# Patient Record
Sex: Female | Born: 1955 | Race: Black or African American | Hispanic: No | Marital: Married | Smoking: Former smoker
Health system: Southern US, Community
[De-identification: ages and names within clinical notes are randomized; demographics above are authoritative.]

## PROBLEM LIST (undated history)

## (undated) ENCOUNTER — Emergency Department (HOSPITAL_COMMUNITY): Admission: EM | Source: Home / Self Care

## (undated) DIAGNOSIS — E119 Type 2 diabetes mellitus without complications: Secondary | ICD-10-CM

## (undated) DIAGNOSIS — E785 Hyperlipidemia, unspecified: Secondary | ICD-10-CM

## (undated) DIAGNOSIS — E669 Obesity, unspecified: Secondary | ICD-10-CM

## (undated) DIAGNOSIS — I10 Essential (primary) hypertension: Secondary | ICD-10-CM

## (undated) HISTORY — DX: Hyperlipidemia, unspecified: E78.5

## (undated) HISTORY — DX: Essential (primary) hypertension: I10

## (undated) HISTORY — PX: CATARACT EXTRACTION: SUR2

## (undated) HISTORY — DX: Obesity, unspecified: E66.9

## (undated) HISTORY — DX: Type 2 diabetes mellitus without complications: E11.9

## (undated) HISTORY — PX: BREAST BIOPSY: SHX20

---

## 1992-12-03 HISTORY — PX: ABDOMINAL HYSTERECTOMY: SHX81

## 1998-12-03 HISTORY — PX: BREAST BIOPSY: SHX20

## 2004-05-12 ENCOUNTER — Ambulatory Visit (HOSPITAL_COMMUNITY): Admission: RE | Admit: 2004-05-12 | Discharge: 2004-05-12 | Payer: Self-pay | Admitting: Obstetrics & Gynecology

## 2004-07-04 ENCOUNTER — Encounter: Admission: RE | Admit: 2004-07-04 | Discharge: 2004-07-04 | Payer: Self-pay | Admitting: Obstetrics & Gynecology

## 2005-05-17 ENCOUNTER — Ambulatory Visit (HOSPITAL_COMMUNITY): Admission: RE | Admit: 2005-05-17 | Discharge: 2005-05-17 | Payer: Self-pay | Admitting: Obstetrics and Gynecology

## 2006-07-18 ENCOUNTER — Ambulatory Visit (HOSPITAL_COMMUNITY): Admission: RE | Admit: 2006-07-18 | Discharge: 2006-07-18 | Payer: Self-pay | Admitting: Obstetrics & Gynecology

## 2006-09-02 ENCOUNTER — Ambulatory Visit (HOSPITAL_COMMUNITY): Admission: RE | Admit: 2006-09-02 | Discharge: 2006-09-02 | Payer: Self-pay | Admitting: Gastroenterology

## 2006-09-02 ENCOUNTER — Ambulatory Visit: Payer: Self-pay | Admitting: Gastroenterology

## 2007-09-11 ENCOUNTER — Ambulatory Visit (HOSPITAL_COMMUNITY): Admission: RE | Admit: 2007-09-11 | Discharge: 2007-09-11 | Payer: Self-pay | Admitting: Obstetrics & Gynecology

## 2008-10-07 ENCOUNTER — Ambulatory Visit (HOSPITAL_COMMUNITY): Admission: RE | Admit: 2008-10-07 | Discharge: 2008-10-07 | Payer: Self-pay | Admitting: Obstetrics & Gynecology

## 2010-01-09 ENCOUNTER — Ambulatory Visit (HOSPITAL_COMMUNITY): Admission: RE | Admit: 2010-01-09 | Discharge: 2010-01-09 | Payer: Self-pay | Admitting: Obstetrics & Gynecology

## 2010-12-24 ENCOUNTER — Encounter: Payer: Self-pay | Admitting: Obstetrics & Gynecology

## 2011-04-20 NOTE — Op Note (Signed)
NAMEMELODEE, LUPE                ACCOUNT NO.:  [id number]   MEDICAL RECORD NO.:  [id number]          PATIENT TYPE:  AMB   LOCATION:  DAY                           FACILITY:  [facility]   PHYSICIAN:  Kassie Mends, M.D.      DATE OF BIRTH:  [birth year]   DATE OF PROCEDURE:  09/02/2006  DATE OF DISCHARGE:                                 OPERATIVE REPORT   PROCEDURE:  Colonoscopy.   INDICATION FOR EXAM:  Dana Boyd is a 55 year old female who presents for  average risk colon cancer screening.   FINDINGS:  1. Normal colon.  No polyps, masses, inflammatory changes, or vascular      ectasias seen.  No diverticula evident.  2. Normal retroflexed view of the rectum.   RECOMMENDATIONS:  1. Screening colonoscopy in 10 years.  2. Follow up with Dr. Jacelyn Pi.   MEDICATIONS:  1. Demerol 75 mg IV.  2. Versed 4 mg IV.   PROCEDURE/TECHNIQUE:  Physician exam was performed, and informed consent was  obtained from the patient after explaining the benefits, risks, and  alternatives to the procedure.  The patient was connected to the monitor and  placed in the left lateral position.  Continuous oxygen was provided by  nasal cannula and IV medications administered through an indwelling cannula.  After administration of sedation and regular exam, the patient's rectum was  intubated, and the scope was advanced under direct visualization to the  cecum.  The scope was subsequently removed slowly and carefully anatomy,  integrity, and texture of the mucosa on the way out.  The patient was  recovered in endoscopy suite and discharged home in satisfactory condition.      Kassie Mends, M.D.  Electronically Signed     SM/MEDQ  D:  09/02/2006  T:  09/03/2006  Job:  [id number]   cc:   Jacelyn Pi, MD

## 2011-05-07 ENCOUNTER — Other Ambulatory Visit: Payer: Self-pay | Admitting: Obstetrics & Gynecology

## 2011-05-07 DIAGNOSIS — Z139 Encounter for screening, unspecified: Secondary | ICD-10-CM

## 2011-05-15 ENCOUNTER — Ambulatory Visit (HOSPITAL_COMMUNITY)
Admission: RE | Admit: 2011-05-15 | Discharge: 2011-05-15 | Disposition: A | Discharge status: Home / Self Care | Payer: Managed Care, Other (non HMO) | Source: Ambulatory Visit | Attending: Obstetrics & Gynecology | Admitting: Obstetrics & Gynecology

## 2011-05-15 DIAGNOSIS — Z1231 Encounter for screening mammogram for malignant neoplasm of breast: Secondary | ICD-10-CM | POA: Insufficient documentation

## 2011-05-15 DIAGNOSIS — Z139 Encounter for screening, unspecified: Secondary | ICD-10-CM

## 2012-05-26 ENCOUNTER — Other Ambulatory Visit: Payer: Self-pay | Admitting: Obstetrics & Gynecology

## 2012-05-26 DIAGNOSIS — Z139 Encounter for screening, unspecified: Secondary | ICD-10-CM

## 2012-06-02 ENCOUNTER — Ambulatory Visit (HOSPITAL_COMMUNITY): Payer: Managed Care, Other (non HMO)

## 2012-06-09 ENCOUNTER — Ambulatory Visit (HOSPITAL_COMMUNITY): Payer: Managed Care, Other (non HMO)

## 2012-06-16 ENCOUNTER — Ambulatory Visit (HOSPITAL_COMMUNITY): Admission: RE | Admit: 2012-06-16 | Payer: Managed Care, Other (non HMO) | Source: Ambulatory Visit

## 2012-07-28 ENCOUNTER — Ambulatory Visit (HOSPITAL_COMMUNITY)
Admission: RE | Admit: 2012-07-28 | Discharge: 2012-07-28 | Disposition: A | Discharge status: Home / Self Care | Payer: Managed Care, Other (non HMO) | Source: Ambulatory Visit | Attending: Obstetrics & Gynecology | Admitting: Obstetrics & Gynecology

## 2012-07-28 DIAGNOSIS — Z1231 Encounter for screening mammogram for malignant neoplasm of breast: Secondary | ICD-10-CM | POA: Insufficient documentation

## 2012-07-28 DIAGNOSIS — Z139 Encounter for screening, unspecified: Secondary | ICD-10-CM

## 2013-05-26 ENCOUNTER — Other Ambulatory Visit: Payer: Self-pay | Admitting: Obstetrics & Gynecology

## 2013-05-26 DIAGNOSIS — Z139 Encounter for screening, unspecified: Secondary | ICD-10-CM

## 2013-06-14 ENCOUNTER — Other Ambulatory Visit: Payer: Self-pay | Admitting: Obstetrics & Gynecology

## 2013-08-10 ENCOUNTER — Ambulatory Visit (HOSPITAL_COMMUNITY): Payer: Managed Care, Other (non HMO)

## 2013-08-17 ENCOUNTER — Other Ambulatory Visit: Payer: Self-pay | Admitting: Obstetrics & Gynecology

## 2013-08-31 ENCOUNTER — Inpatient Hospital Stay (HOSPITAL_COMMUNITY): Admission: RE | Admit: 2013-08-31 | Payer: Managed Care, Other (non HMO) | Source: Ambulatory Visit

## 2013-09-15 ENCOUNTER — Other Ambulatory Visit: Payer: Self-pay | Admitting: Obstetrics & Gynecology

## 2013-09-17 ENCOUNTER — Encounter: Payer: Self-pay | Admitting: Obstetrics & Gynecology

## 2013-09-17 ENCOUNTER — Ambulatory Visit (INDEPENDENT_AMBULATORY_CARE_PROVIDER_SITE_OTHER): Payer: BC Managed Care – PPO | Admitting: Obstetrics & Gynecology

## 2013-09-17 ENCOUNTER — Encounter (INDEPENDENT_AMBULATORY_CARE_PROVIDER_SITE_OTHER): Payer: Self-pay

## 2013-09-17 VITALS — BP 158/70 | Ht 62.0 in | Wt 194.0 lb

## 2013-09-17 DIAGNOSIS — Z01419 Encounter for gynecological examination (general) (routine) without abnormal findings: Secondary | ICD-10-CM

## 2013-09-17 DIAGNOSIS — Z1212 Encounter for screening for malignant neoplasm of rectum: Secondary | ICD-10-CM

## 2013-09-17 DIAGNOSIS — I10 Essential (primary) hypertension: Secondary | ICD-10-CM | POA: Insufficient documentation

## 2013-09-17 DIAGNOSIS — E7800 Pure hypercholesterolemia, unspecified: Secondary | ICD-10-CM

## 2013-09-17 NOTE — Progress Notes (Signed)
Patient ID: Dana Boyd, female   DOB: 25-Aug-1956, 57 y.o.   MRN: [id number] Subjective:     Dana Boyd is a 57 y.o. female here for a routine exam.  No LMP recorded. Patient has had a hysterectomy. No obstetric history on file. Current complaints: none.     Gynecologic History No LMP recorded. Patient has had a hysterectomy. Contraception: status post hysterectomy Last Pap: na. Results were: normal Last mammogram: 2013. Results were: normal  Past Medical History  Diagnosis Date  . Hyperlipidemia   . Hypertension     Past Surgical History  Procedure Laterality Date  . Cesarean section    . Breast biopsy Right     OB History   Grav Para Term Preterm Abortions TAB SAB Ect Mult Living                  History   Social History  . Marital Status: Married    Spouse Name: N/A    Number of Children: N/A  . Years of Education: N/A   Social History Main Topics  . Smoking status: Former Smoker    Types: Cigarettes  . Smokeless tobacco: Never Used  . Alcohol Use: Yes     Comment: occ  . Drug Use: No  . Sexual Activity: Yes    Birth Control/ Protection: Surgical   Other Topics Concern  . None   Social History Narrative  . None    Family History  Problem Relation Age of Onset  . Heart attack Mother      Review of Systems  Review of Systems  Constitutional: Negative for fever, chills, weight loss, malaise/fatigue and diaphoresis.  HENT: Negative for hearing loss, ear pain, nosebleeds, congestion, sore throat, neck pain, tinnitus and ear discharge.   Eyes: Negative for blurred vision, double vision, photophobia, pain, discharge and redness.  Respiratory: Negative for cough, hemoptysis, sputum production, shortness of breath, wheezing and stridor.   Cardiovascular: Negative for chest pain, palpitations, orthopnea, claudication, leg swelling and PND.  Gastrointestinal: negative for abdominal pain. Negative for heartburn, nausea, vomiting, diarrhea,  constipation, blood in stool and melena.  Genitourinary: Negative for dysuria, urgency, frequency, hematuria and flank pain.  Musculoskeletal: Negative for myalgias, back pain, joint pain and falls.  Skin: Negative for itching and rash.  Neurological: Negative for dizziness, tingling, tremors, sensory change, speech change, focal weakness, seizures, loss of consciousness, weakness and headaches.  Endo/Heme/Allergies: Negative for environmental allergies and polydipsia. Does not bruise/bleed easily.  Psychiatric/Behavioral: Negative for depression, suicidal ideas, hallucinations, memory loss and substance abuse. The patient is not nervous/anxious and does not have insomnia.        Objective:    Physical Exam  Vitals reviewed. Constitutional: She is oriented to person, place, and time. She appears well-developed and well-nourished.  HENT:  Head: Normocephalic and atraumatic.        Right Ear: External ear normal.  Left Ear: External ear normal.  Nose: Nose normal.  Mouth/Throat: Oropharynx is clear and moist.  Eyes: Conjunctivae and EOM are normal. Pupils are equal, round, and reactive to light. Right eye exhibits no discharge. Left eye exhibits no discharge. No scleral icterus.  Neck: Normal range of motion. Neck supple. No tracheal deviation present. No thyromegaly present.  Cardiovascular: Normal rate, regular rhythm, normal heart sounds and intact distal pulses.  Exam reveals no gallop and no friction rub.   No murmur heard. Respiratory: Effort normal and breath sounds normal. No respiratory distress. She has no wheezes.  She has no rales. She exhibits no tenderness.  GI: Soft. Bowel sounds are normal. She exhibits no distension and no mass. There is no tenderness. There is no rebound and no guarding.  Genitourinary:  Breasts no masses skin changes or nipple changes bilaterally      Vulva is normal without lesions Vagina is pink moist without discharge Cervix absent Uterus is  absent Adnexa is negative Rectal    hemoccult negative, normal tone, no masses, hemorrhoids  Musculoskeletal: Normal range of motion. She exhibits no edema and no tenderness.  Neurological: She is alert and oriented to person, place, and time. She has normal reflexes. She displays normal reflexes. No cranial nerve deficit. She exhibits normal muscle tone. Coordination normal.  Skin: Skin is warm and dry. No rash noted. No erythema. No pallor.  Psychiatric: She has a normal mood and affect. Her behavior is normal. Judgment and thought content normal.       Assessment:    Healthy female exam.    Plan:    Mammogram ordered. Follow up in: 1 year.

## 2013-10-05 ENCOUNTER — Ambulatory Visit (HOSPITAL_COMMUNITY)
Admission: RE | Admit: 2013-10-05 | Discharge: 2013-10-05 | Disposition: A | Discharge status: Home / Self Care | Payer: BC Managed Care – PPO | Source: Ambulatory Visit | Attending: Obstetrics & Gynecology | Admitting: Obstetrics & Gynecology

## 2013-10-05 DIAGNOSIS — Z1231 Encounter for screening mammogram for malignant neoplasm of breast: Secondary | ICD-10-CM | POA: Insufficient documentation

## 2013-10-05 DIAGNOSIS — Z139 Encounter for screening, unspecified: Secondary | ICD-10-CM

## 2013-10-09 ENCOUNTER — Other Ambulatory Visit: Payer: Self-pay | Admitting: Obstetrics & Gynecology

## 2013-10-09 DIAGNOSIS — R928 Other abnormal and inconclusive findings on diagnostic imaging of breast: Secondary | ICD-10-CM

## 2013-11-18 ENCOUNTER — Other Ambulatory Visit: Payer: Self-pay | Admitting: Obstetrics & Gynecology

## 2013-11-18 ENCOUNTER — Ambulatory Visit (HOSPITAL_COMMUNITY)
Admission: RE | Admit: 2013-11-18 | Discharge: 2013-11-18 | Disposition: A | Discharge status: Home / Self Care | Payer: BC Managed Care – PPO | Source: Ambulatory Visit | Attending: Obstetrics & Gynecology | Admitting: Obstetrics & Gynecology

## 2013-11-18 DIAGNOSIS — R928 Other abnormal and inconclusive findings on diagnostic imaging of breast: Secondary | ICD-10-CM | POA: Insufficient documentation

## 2013-11-18 DIAGNOSIS — N6009 Solitary cyst of unspecified breast: Secondary | ICD-10-CM | POA: Insufficient documentation

## 2014-01-06 IMAGING — US US BREAST*R*
1 series · 2 of 2 positions shown · non-contrast
Comparison: With priors

CLINICAL DATA: Abnormal right screening mammogram.

EXAM:
DIGITAL DIAGNOSTIC  RIGHT MAMMOGRAM
ULTRASOUND RIGHT BREAST

[Series 1: us breast*right* · 0.05mm/px · 2 of 2 slices shown]
[im 1/2]
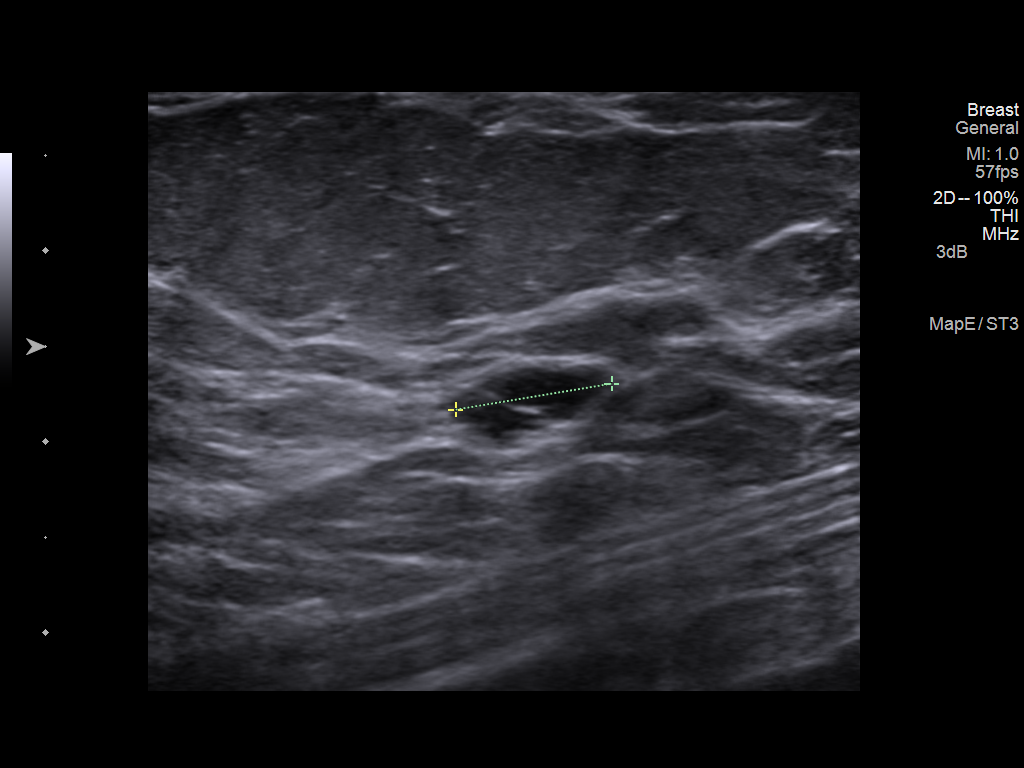
[im 2/2]
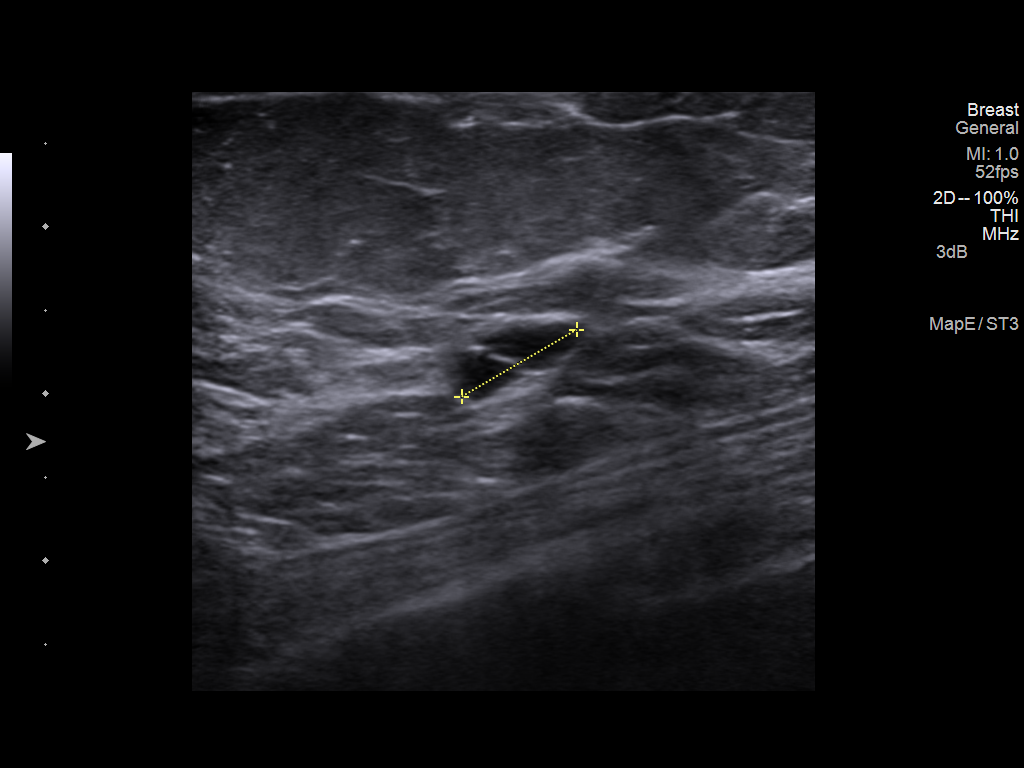

[2 of 2 positions shown; findings below may reference images not displayed]

ACR Breast Density Category b: There are scattered areas of
fibroglandular density.
FINDINGS: Spot compression views of the medial aspect of the left breast was
performed. There is persistent of a low-density 8 mm partially
obscured nodule.

On physical exam I do not palpate a mass in the right breast.

Ultrasound is performed, showing a cluster of cysts in the right
breast at 4 o'clock 2 cm from the nipple measuring 8 x 4 x 8 mm. No
solid mass is detected.
IMPRESSION: Right breast cysts.  No evidence of malignancy.

RECOMMENDATION:
Bilateral screening mammogram in 1 year is recommended.

I have discussed the findings and recommendations with the patient.
Results were also provided in writing at the conclusion of the
visit. If applicable, a reminder letter will be sent to the patient
regarding the next appointment.

BI-RADS CATEGORY  2: Benign Finding(s)

## 2014-10-07 ENCOUNTER — Other Ambulatory Visit (HOSPITAL_COMMUNITY): Payer: Self-pay | Admitting: Family Medicine

## 2014-10-07 DIAGNOSIS — Z1231 Encounter for screening mammogram for malignant neoplasm of breast: Secondary | ICD-10-CM

## 2014-11-22 ENCOUNTER — Ambulatory Visit (HOSPITAL_COMMUNITY)
Admission: RE | Admit: 2014-11-22 | Discharge: 2014-11-22 | Disposition: A | Discharge status: Home / Self Care | Payer: BC Managed Care – PPO | Source: Ambulatory Visit | Attending: Family Medicine | Admitting: Family Medicine

## 2014-11-22 DIAGNOSIS — Z1231 Encounter for screening mammogram for malignant neoplasm of breast: Secondary | ICD-10-CM | POA: Diagnosis not present

## 2016-02-03 ENCOUNTER — Encounter: Payer: Self-pay | Admitting: *Deleted

## 2016-02-07 ENCOUNTER — Encounter: Payer: Self-pay | Admitting: Obstetrics and Gynecology

## 2016-02-07 ENCOUNTER — Ambulatory Visit (INDEPENDENT_AMBULATORY_CARE_PROVIDER_SITE_OTHER): Payer: BLUE CROSS/BLUE SHIELD | Admitting: Obstetrics and Gynecology

## 2016-02-07 VITALS — BP 142/80 | Ht 63.0 in | Wt 191.5 lb

## 2016-02-07 DIAGNOSIS — N83202 Unspecified ovarian cyst, left side: Secondary | ICD-10-CM | POA: Diagnosis not present

## 2016-02-07 DIAGNOSIS — N83201 Unspecified ovarian cyst, right side: Secondary | ICD-10-CM | POA: Insufficient documentation

## 2016-02-07 NOTE — Progress Notes (Signed)
Patient ID: Dana Boyd, female   DOB: 1956/04/08, 60 y.o.   MRN: [id number] Pt here today for "pelvic cyst". Pt was sent by [facility], pt was told that she had a cyst and was not told where it was. Pt is S/P abdominal hysterectomy, with ovaries remaining. Pt denies any pain at this time. Pt states that she did have an [place] for this problem.

## 2016-02-07 NOTE — Progress Notes (Signed)
Patient ID: Dana Boyd, female   DOB: February 05, 1956, 60 y.o.   MRN: [id number]   [facility] Visit  Patient name: Dana Boyd MRN [id number]  Date of birth: [birth year]  CC & HPI:  Dana Boyd is a 60 y.o. female presenting today for referrral from [facility] for ovarian cyst on left  ROS:  Pt had tah rso done in past for prior ov cyst. One ovary remains.  Pertinent History Reviewed:   Reviewed: Significant for assymptomatic cyst Medical         Past Medical History  Diagnosis Date  . Hyperlipidemia   . Hypertension   . Obesity                               Surgical Hx:    Past Surgical History  Procedure Laterality Date  . Cesarean section    . Breast biopsy Right   . Abdominal hysterectomy  1994    left the ovaries   Medications: Reviewed & Updated - see associated section                       Current outpatient prescriptions:  .  hydrochlorothiazide (HYDRODIURIL) 25 MG tablet, Take 25 mg by mouth daily., Disp: , Rfl:  .  atorvastatin (LIPITOR) 20 MG tablet, Take 20 mg by mouth daily. Reported on 02/07/2016, Disp: , Rfl:    Social History: Reviewed -  reports that she has quit smoking. Her smoking use included Cigarettes. She quit after 15 years of use. She has never used smokeless tobacco.  Objective Findings:  Vitals: Blood pressure 142/80, height 5\' 3"  (1.6 m), weight 191 lb 8 oz (86.864 kg).  Physical Examination: General appearance - alert, well appearing, and in no distress, oriented to person, place, and time and overweight Mental status - alert, oriented to person, place, and time, normal mood, behavior, speech, dress, motor activity, and thought processes Eyes - pupils equal and reactive, extraocular eye movements intact   Assessment & Plan:   A:  1. Simple cyst postmenopausal   P:  1. Check Ca 125,  2  Recheck 3 months if normal.

## 2016-02-08 ENCOUNTER — Telehealth: Payer: Self-pay | Admitting: *Deleted

## 2016-02-08 LAB — CA 125: CA 125: 7.5 U/mL (ref 0.0–38.1)

## 2016-02-08 NOTE — Telephone Encounter (Signed)
-----   Message from Jonnie Kind, MD sent at 02/08/2016  3:56 PM EST ----- Normal ca 125

## 2016-02-08 NOTE — Telephone Encounter (Signed)
Pt aware of Ca125 results.

## 2016-05-14 ENCOUNTER — Other Ambulatory Visit: Payer: Self-pay | Admitting: Obstetrics and Gynecology

## 2016-05-14 DIAGNOSIS — N83202 Unspecified ovarian cyst, left side: Secondary | ICD-10-CM

## 2016-05-16 ENCOUNTER — Ambulatory Visit (INDEPENDENT_AMBULATORY_CARE_PROVIDER_SITE_OTHER): Payer: BLUE CROSS/BLUE SHIELD | Admitting: Obstetrics and Gynecology

## 2016-05-16 ENCOUNTER — Ambulatory Visit (INDEPENDENT_AMBULATORY_CARE_PROVIDER_SITE_OTHER): Payer: BLUE CROSS/BLUE SHIELD

## 2016-05-16 ENCOUNTER — Encounter: Payer: Self-pay | Admitting: Obstetrics and Gynecology

## 2016-05-16 ENCOUNTER — Other Ambulatory Visit: Payer: Self-pay | Admitting: Obstetrics and Gynecology

## 2016-05-16 VITALS — BP 132/80 | Ht 62.0 in | Wt 184.0 lb

## 2016-05-16 DIAGNOSIS — N83202 Unspecified ovarian cyst, left side: Secondary | ICD-10-CM

## 2016-05-16 DIAGNOSIS — N83201 Unspecified ovarian cyst, right side: Secondary | ICD-10-CM

## 2016-05-16 NOTE — Progress Notes (Signed)
Patient ID: Dana Boyd, female   DOB: 07-18-1956, 60 y.o.   MRN: [id number]    [facility] Visit  @DATE @            Patient name: Dana Boyd MRN [id number]  Date of birth: 15-Jun-1956  CC & HPI:  Dana Boyd is a 60 y.o. female presenting today to discuss [place] results. She has no complaints at this time.  Pt was seen 02/07/2016 for referral for evaluation of simple postmenopausal ovarian cyst.  ROS:  Review of Systems  All other systems reviewed and are negative.  Pertinent History Reviewed:   Reviewed: Significant for cesarean section, partial abdominal hysterectomy Medical         Past Medical History  Diagnosis Date  . Hyperlipidemia   . Hypertension   . Obesity                               Surgical Hx:    Past Surgical History  Procedure Laterality Date  . Cesarean section    . Breast biopsy Right   . Abdominal hysterectomy  1994    left the ovaries   Medications: Reviewed & Updated - see associated section                       Current outpatient prescriptions:  .  atorvastatin (LIPITOR) 20 MG tablet, Take 20 mg by mouth daily. Reported on 02/07/2016, Disp: , Rfl:  .  hydrochlorothiazide (HYDRODIURIL) 25 MG tablet, Take 25 mg by mouth daily., Disp: , Rfl:    Social History: Reviewed -  reports that she has quit smoking. Her smoking use included Cigarettes. She quit after 15 years of use. She has never used smokeless tobacco.  Objective Findings:  Vitals: Blood pressure 132/80, height 5\' 2"  (1.575 m), weight 184 lb (83.462 kg).  Physical Examination: discussion only  Discussed with pt risks and benefits of removal of cyst. At end of discussion, pt had opportunity to ask questions and has no further questions at this time. Reassuring factors such as simple thin cyst membrane, absence of internal echoes solid components or thick septations discussed with the patient. Pros and cons of observation versus surgical removal discussed with patient with  recommendations that interval follow-up would be very reasonable option  Greater than 50% was spent in counseling and coordination of care with the patient. Total time greater than: 15 minutes  Assessment & Plan:   A:  1. Simple 5 cm unilocular ovarian vs. paratubal cyst on right without septation or Solid component  P:  1. Repeat US in 6 months 2. Follow up prn   By signing my name below, I, Rowan Blase, attest that this documentation has been prepared under the direction and in the presence of Jonnie Kind, MD . Electronically Signed: Rowan Blase, Scribe. 05/16/2016. 2:24 PM.  I personally performed the services described in this documentation, which was SCRIBED in my presence. The recorded information has been reviewed and considered accurate. It has been edited as necessary during review. Jonnie Kind, MD

## 2016-05-16 NOTE — Progress Notes (Signed)
PELVIC US TA/TV W/DOPPLER: normal vag cuff,simple right ov cyst 5.1 x 4.6 x 3.3 cm w/arterial and venous flow seen,normal lt ov(ov's appear to be mobile),no free fluid,no pain during ultrasound.

## 2016-08-01 ENCOUNTER — Telehealth: Payer: Self-pay | Admitting: Gastroenterology

## 2016-08-01 NOTE — Telephone Encounter (Signed)
Pt is due 10 yr colonoscopy °

## 2016-08-01 NOTE — Telephone Encounter (Signed)
Letter mailed

## 2016-08-09 ENCOUNTER — Telehealth: Payer: Self-pay

## 2016-08-09 NOTE — Telephone Encounter (Signed)
Pt received letter that it was time to schedule her colonoscopy. Please call [phone]

## 2016-08-13 ENCOUNTER — Other Ambulatory Visit (HOSPITAL_COMMUNITY): Payer: Self-pay | Admitting: Internal Medicine

## 2016-08-13 ENCOUNTER — Telehealth: Payer: Self-pay

## 2016-08-13 DIAGNOSIS — Z1231 Encounter for screening mammogram for malignant neoplasm of breast: Secondary | ICD-10-CM

## 2016-08-13 NOTE — Telephone Encounter (Signed)
See separate triage.  

## 2016-08-16 ENCOUNTER — Ambulatory Visit (HOSPITAL_COMMUNITY)
Admission: RE | Admit: 2016-08-16 | Discharge: 2016-08-16 | Disposition: A | Discharge status: Home / Self Care | Payer: BLUE CROSS/BLUE SHIELD | Source: Ambulatory Visit | Attending: Internal Medicine | Admitting: Internal Medicine

## 2016-08-16 DIAGNOSIS — Z1231 Encounter for screening mammogram for malignant neoplasm of breast: Secondary | ICD-10-CM | POA: Insufficient documentation

## 2016-08-22 ENCOUNTER — Other Ambulatory Visit: Payer: Self-pay

## 2016-08-22 DIAGNOSIS — Z1211 Encounter for screening for malignant neoplasm of colon: Secondary | ICD-10-CM

## 2016-08-22 NOTE — Telephone Encounter (Signed)
PREPOPIK-DRINK WATER TO KEEP URINE LIGHT YELLOW.  FULL LIQUIDS WITH BREAKFAST.  Full Liquid Diet A high-calorie, high-protein supplement should be used to meet your nutritional requirements when the full liquid diet is continued for more than 2 or 3 days. If this diet is to be used for an extended period of time (more than 7 days), a multivitamin should be considered.  Breads and Starches  Allowed: None are allowed   Avoid: Any others.    Potatoes/Pasta/Rice  Allowed: ANY ITEM AS A SOUP OR SMALL PLATE OF MASHED POTATOES OR SCRAMBLED EGGS.       Vegetables  Allowed: Strained tomato or vegetable juice. Vegetables pureed in soup.   Avoid: Any others.    Fruit  Allowed: Any strained fruit juices and fruit drinks. Include 1 serving of citrus or vitamin C-enriched fruit juice daily.   Avoid: Any others.  Meat and Meat Substitutes  Allowed: Egg  Avoid: Any meat, fish, or fowl. All cheese.  Milk  Allowed: SOY Milk beverages, including milk shakes and instant breakfast mixes. Smooth yogurt.   Avoid: Any others. Avoid dairy products if not tolerated.    Soups and Combination Foods  Allowed: Broth, strained cream soups. Strained, broth-based soups.   Avoid: Any others.    Desserts and Sweets  Allowed: flavored gelatin, tapioca, ice cream, sherbet, smooth pudding, junket, fruit ices, frozen ice pops, pudding pops, frozen fudge pops, chocolate syrup. Sugar, honey, jelly, syrup.   Avoid: Any others.  Fats and Oils  Allowed: Margarine, butter, cream, sour cream, oils.   Avoid: Any others.  Beverages  Allowed: All.   Avoid: None.  Condiments  Allowed: Iodized salt, pepper, spices, flavorings. Cocoa powder.   Avoid: Any others.    SAMPLE MEAL PLAN Breakfast   cup orange juice.   1 OR 2 EGGS  1 cup milk.   1 cup beverage (coffee or tea).   Cream or sugar, if desired.    Midmorning Snack  2 SCRAMBLED OR HARD BOILED EGG   Lunch  1 cup cream  soup.    cup fruit juice.   1 cup milk.    cup custard.   1 cup beverage (coffee or tea).   Cream or sugar, if desired.    Midafternoon Snack  1 cup milk shake.  Dinner  1 cup cream soup.    cup fruit juice.   1 cup MILK    cup pudding.   1 cup beverage (coffee or tea).   Cream or sugar, if desired.  Evening Snack  1 cup supplement.  To increase calories, add sugar, cream, butter, or margarine if possible. Nutritional supplements will also increase the total calories.

## 2016-08-22 NOTE — Telephone Encounter (Signed)
Gastroenterology Pre-Procedure Review  Request Date:08/13/2016 Requesting Physician: ON RECALL  PATIENT REVIEW QUESTIONS: The patient responded to the following health history questions as indicated:    1. Diabetes Melitis: no 2. Joint replacements in the past 12 months: no 3. Major health problems in the past 3 months: no 4. Has an artificial valve or MVP: no 5. Has a defibrillator: no 6. Has been advised in past to take antibiotics in advance of a procedure like teeth cleaning: no 7. Family history of colon cancer: no  8. Alcohol Use: no 9. History of sleep apnea: no     MEDICATIONS & ALLERGIES:    Patient reports the following regarding taking any blood thinners:   Plavix? no Aspirin? no Coumadin? no  Patient confirms/reports the following medications:  Current Outpatient Prescriptions  Medication Sig Dispense Refill  . hydrochlorothiazide (HYDRODIURIL) 25 MG tablet Take 25 mg by mouth daily.    . NON FORMULARY Vitamin D  1000 IU   One daily    . Omega-3 Fatty Acids (FISH OIL) 1200 MG CPDR Take by mouth.    Marland Kitchen atorvastatin (LIPITOR) 20 MG tablet Take 20 mg by mouth daily. Reported on 02/07/2016     No current facility-administered medications for this visit.     Patient confirms/reports the following allergies:  No Known Allergies  No orders of the defined types were placed in this encounter.   AUTHORIZATION INFORMATION Primary Insurance:   ID #:   Group #:  Pre-Cert / Auth required:  Pre-Cert / Auth #:   Secondary Insurance:  ID #:   Group #:  Pre-Cert / Auth required: Pre-Cert / Auth #:   SCHEDULE INFORMATION: Procedure has been scheduled as follows:  Date:   09/03/2016                Time:  11:15 AM Location: [facility] Stay  This Gastroenterology Pre-Precedure Review Form is being routed to the following provider(s): Barney Drain, MD

## 2016-08-27 MED ORDER — SOD PICOSULFATE-MAG OX-CIT ACD 10-3.5-12 MG-GM-GM PO PACK: 1.0000 | PACK | ORAL | 0 refills | Status: DC

## 2016-08-27 NOTE — Telephone Encounter (Signed)
Rx sent to the pharmacy and instructions mailed to pt.  

## 2016-08-31 ENCOUNTER — Telehealth: Payer: Self-pay

## 2016-08-31 NOTE — Telephone Encounter (Signed)
Per Ginger, PA not required for screening colonoscopy. Ref # [id number].

## 2016-09-03 ENCOUNTER — Encounter (HOSPITAL_COMMUNITY)
Admission: RE | Disposition: A | Discharge status: Home / Self Care | Payer: Self-pay | Source: Ambulatory Visit | Attending: Gastroenterology

## 2016-09-03 ENCOUNTER — Ambulatory Visit (HOSPITAL_COMMUNITY)
Admission: RE | Admit: 2016-09-03 | Discharge: 2016-09-03 | Disposition: A | Discharge status: Home / Self Care | Payer: BLUE CROSS/BLUE SHIELD | Source: Ambulatory Visit | Attending: Gastroenterology | Admitting: Gastroenterology

## 2016-09-03 ENCOUNTER — Encounter (HOSPITAL_COMMUNITY): Payer: Self-pay | Admitting: *Deleted

## 2016-09-03 DIAGNOSIS — K648 Other hemorrhoids: Secondary | ICD-10-CM | POA: Diagnosis not present

## 2016-09-03 DIAGNOSIS — Z1211 Encounter for screening for malignant neoplasm of colon: Secondary | ICD-10-CM | POA: Diagnosis not present

## 2016-09-03 DIAGNOSIS — Z9889 Other specified postprocedural states: Secondary | ICD-10-CM | POA: Insufficient documentation

## 2016-09-03 DIAGNOSIS — Z79899 Other long term (current) drug therapy: Secondary | ICD-10-CM | POA: Insufficient documentation

## 2016-09-03 DIAGNOSIS — Z818 Family history of other mental and behavioral disorders: Secondary | ICD-10-CM | POA: Insufficient documentation

## 2016-09-03 DIAGNOSIS — Z90710 Acquired absence of both cervix and uterus: Secondary | ICD-10-CM | POA: Insufficient documentation

## 2016-09-03 DIAGNOSIS — Z820 Family history of epilepsy and other diseases of the nervous system: Secondary | ICD-10-CM | POA: Insufficient documentation

## 2016-09-03 DIAGNOSIS — Z87891 Personal history of nicotine dependence: Secondary | ICD-10-CM | POA: Diagnosis not present

## 2016-09-03 DIAGNOSIS — E785 Hyperlipidemia, unspecified: Secondary | ICD-10-CM | POA: Insufficient documentation

## 2016-09-03 DIAGNOSIS — K644 Residual hemorrhoidal skin tags: Secondary | ICD-10-CM | POA: Diagnosis not present

## 2016-09-03 DIAGNOSIS — Z8249 Family history of ischemic heart disease and other diseases of the circulatory system: Secondary | ICD-10-CM | POA: Insufficient documentation

## 2016-09-03 DIAGNOSIS — Z6834 Body mass index (BMI) 34.0-34.9, adult: Secondary | ICD-10-CM | POA: Insufficient documentation

## 2016-09-03 DIAGNOSIS — Z801 Family history of malignant neoplasm of trachea, bronchus and lung: Secondary | ICD-10-CM | POA: Diagnosis not present

## 2016-09-03 DIAGNOSIS — E669 Obesity, unspecified: Secondary | ICD-10-CM | POA: Insufficient documentation

## 2016-09-03 DIAGNOSIS — Q438 Other specified congenital malformations of intestine: Secondary | ICD-10-CM | POA: Diagnosis not present

## 2016-09-03 DIAGNOSIS — D120 Benign neoplasm of cecum: Secondary | ICD-10-CM | POA: Diagnosis not present

## 2016-09-03 DIAGNOSIS — I10 Essential (primary) hypertension: Secondary | ICD-10-CM | POA: Insufficient documentation

## 2016-09-03 HISTORY — PX: COLONOSCOPY: SHX5424

## 2016-09-03 SURGERY — COLONOSCOPY
Anesthesia: Moderate Sedation

## 2016-09-03 MED ORDER — MIDAZOLAM HCL 5 MG/5ML IJ SOLN
INTRAMUSCULAR | Status: AC
  Filled 2016-09-03: qty 10

## 2016-09-03 MED ORDER — MIDAZOLAM HCL 5 MG/5ML IJ SOLN
INTRAMUSCULAR | Status: DC | PRN
  Administered 2016-09-03: 2 mg via INTRAVENOUS
  Administered 2016-09-03 (×2): 1 mg via INTRAVENOUS
  Administered 2016-09-03: 2 mg via INTRAVENOUS

## 2016-09-03 MED ORDER — SODIUM CHLORIDE 0.9 % IV SOLN
INTRAVENOUS | Status: DC
  Administered 2016-09-03: 11:00:00 via INTRAVENOUS

## 2016-09-03 MED ORDER — MEPERIDINE HCL 100 MG/ML IJ SOLN
INTRAMUSCULAR | Status: DC | PRN
  Administered 2016-09-03 (×2): 25 mg via INTRAVENOUS

## 2016-09-03 MED ORDER — MEPERIDINE HCL 100 MG/ML IJ SOLN
INTRAMUSCULAR | Status: AC
  Filled 2016-09-03: qty 2

## 2016-09-03 MED ORDER — STERILE WATER FOR IRRIGATION IR SOLN
Status: DC | PRN
  Administered 2016-09-03: 2.5 mL

## 2016-09-03 NOTE — Op Note (Signed)
[facility] Patient Name: Dana Boyd Procedure Date: 09/03/2016 11:18 AM MRN: [id number] Date of Birth: [birth year] Attending MD: Barney Drain , MD CSN: [id number] Age: 60 Admit Type: Outpatient Procedure:                Colonoscopy WITH COLD FORCEPS POLYPECTOMY Indications:              Screening for colorectal malignant neoplasm Providers:                Barney Drain, MD, Lurline Del, RN, Randa Spike,                            Technician Referring MD:             Tania Ade, MD Medicines:                Midazolam 6 mg IV, Meperidine 50 mg IV Complications:            No immediate complications. Estimated Blood Loss:     Estimated blood loss was minimal. Procedure:                Pre-Anesthesia Assessment:                           - Prior to the procedure, a History and Physical                            was performed, and patient medications and                            allergies were reviewed. The patient's tolerance of                            previous anesthesia was also reviewed. The risks                            and benefits of the procedure and the sedation                            options and risks were discussed with the patient.                            All questions were answered, and informed consent                            was obtained. Prior Anticoagulants: The patient has                            taken no previous anticoagulant or antiplatelet                            agents. ASA Grade Assessment: II - A patient with                            mild systemic disease. After reviewing the risks  and benefits, the patient was deemed in                            satisfactory condition to undergo the procedure.                            After obtaining informed consent, the colonoscope                            was passed under direct vision. Throughout the                            procedure, the patient's blood  pressure, pulse, and                            oxygen saturations were monitored continuously. The                            [id number] scope was introduced through                            the anus and advanced to the the cecum, identified                            by appendiceal orifice and ileocecal valve. The                            colonoscopy was somewhat difficult due to a                            tortuous colon. Successful completion of the                            procedure was aided by applying abdominal pressure                            and COLOWRAP. The patient tolerated the procedure                            fairly well. The quality of the bowel preparation                            was good. The ileocecal valve, appendiceal orifice,                            and rectum were photographed. Scope In: 11:35:38 AM Scope Out: 11:54:51 AM Scope Withdrawal Time: 0 hours 10 minutes 56 seconds  Total Procedure Duration: 0 hours 19 minutes 13 seconds  Findings:      The digital rectal exam findings include non-thrombosed external       hemorrhoids.      A 4 mm polyp was found in the ileocecal valve. The polyp was sessile.       The polyp was removed with a cold biopsy forceps. Resection and  retrieval were complete.      The recto-sigmoid colon and sigmoid colon were moderately redundant.      Non-bleeding internal hemorrhoids were found. The hemorrhoids were       moderate.      Non-bleeding external hemorrhoids were found. The hemorrhoids were large. Impression:               - Non-thrombosed external hemorrhoids found on                            digital rectal exam.                           - One 4 mm polyp at the ileocecal valve, removed                            with a cold biopsy forceps.                           - Redundant LEFT colon.                           - Non-bleeding internal hemorrhoids.                           - Non-bleeding  external hemorrhoids. Moderate Sedation:      Moderate (conscious) sedation was administered by the endoscopy nurse       and supervised by the endoscopist. The following parameters were       monitored: oxygen saturation, heart rate, blood pressure, and response       to care. Total physician intraservice time was 32 minutes. Recommendation:           - High fiber diet.                           - Continue present medications.                           - Await pathology results.                           - Repeat colonoscopy in 5-10 years for surveillance.                           - Patient has a contact number available for                            emergencies. The signs and symptoms of potential                            delayed complications were discussed with the                            patient. Return to normal activities tomorrow.                            Written discharge instructions were provided to the  patient. Procedure Code(s):        --- Professional ---                           [phone], Colonoscopy, flexible; with biopsy, single                            or multiple                           99152, Moderate sedation services provided by the                            same physician or other qualified health care                            professional performing the diagnostic or                            therapeutic service that the sedation supports,                            requiring the presence of an independent trained                            observer to assist in the monitoring of the                            patient's level of consciousness and physiological                            status; initial 15 minutes of intraservice time,                            patient age 22 years or older                           [phone], Moderate sedation services; each additional                            15 minutes intraservice  time Diagnosis Code(s):        --- Professional ---                           Z12.11, Encounter for screening for malignant                            neoplasm of colon                           D12.0, Benign neoplasm of cecum                           K64.4, Residual hemorrhoidal skin tags  K64.8, Other hemorrhoids                           Q43.8, Other specified congenital malformations of                            intestine CPT copyright 2016 American Medical Association. All rights reserved. The codes documented in this report are preliminary and upon coder review may  be revised to meet current compliance requirements. Barney Drain, MD Barney Drain, MD 09/03/2016 12:02:50 PM This report has been signed electronically. Number of Addenda: 0

## 2016-09-03 NOTE — H&P (Signed)
  Primary Care Physician:  Florian Buff, MD Primary Gastroenterologist:  Dr. Oneida Alar  Pre-Procedure History & Physical: HPI:  Dana Boyd is a 60 y.o. female here for [place].  Past Medical History:  Diagnosis Date  . Hyperlipidemia   . Hypertension   . Obesity     Past Surgical History:  Procedure Laterality Date  . ABDOMINAL HYSTERECTOMY  1994   left the ovaries  . BREAST BIOPSY Right   . CESAREAN SECTION      Prior to Admission medications   Medication Sig Start Date End Date Taking? Authorizing Provider  hydrochlorothiazide (HYDRODIURIL) 25 MG tablet Take 25 mg by mouth daily.   Yes Historical Provider, MD  NON FORMULARY Vitamin D  1000 IU   One daily   Yes Historical Provider, MD  Omega-3 Fatty Acids (FISH OIL) 1200 MG CPDR Take by mouth.   Yes Historical Provider, MD  Sod Picosulfate-Mag Ox-Cit Acd 10-3.5-12 MG-GM-GM PACK Take 1 Container by mouth as directed. 08/27/16  Yes [name], MD  atorvastatin (LIPITOR) 20 MG tablet Take 20 mg by mouth daily. Reported on 02/07/2016    Historical Provider, MD    Allergies as of 08/22/2016  . (No Known Allergies)    Family History  Problem Relation Age of Onset  . Heart attack Mother   . Heart disease Mother   . Cancer Father     lung  . Multiple sclerosis Brother   . Heart disease Maternal Grandmother   . Dementia Paternal Grandmother     Social History   Social History  . Marital status: Married    Spouse name: N/A  . Number of children: N/A  . Years of education: N/A   Occupational History  . Not on file.   Social History Main Topics  . Smoking status: Former Smoker    Years: 15.00    Types: Cigarettes  . Smokeless tobacco: Never Used  . Alcohol use No  . Drug use: No  . Sexual activity: Yes    Birth control/ protection: Surgical   Other Topics Concern  . Not on file   Social History Narrative  . No narrative on file    Review of Systems: See HPI, otherwise negative  ROS   Physical Exam: BP 134/68   Pulse 67   Temp 98.3 F (36.8 C) (Oral)   Resp 19   Ht 5\' 2"  (1.575 m)   Wt 189 lb (85.7 kg)   SpO2 95%   BMI 34.57 kg/m  General:   Alert,  pleasant and cooperative in NAD Head:  Normocephalic and atraumatic. Neck:  Supple; Lungs:  Clear throughout to auscultation.    Heart:  Regular rate and rhythm. Abdomen:  Soft, nontender and nondistended. Normal bowel sounds, without guarding, and without rebound.   Neurologic:  Alert and  oriented x4;  grossly normal neurologically.  Impression/Plan:     SCREENING  Plan:  1. TCS TODAY

## 2016-09-03 NOTE — Discharge Instructions (Signed)
You had 1 polyp removed. You have moderate internal AND LARGE EXTERNAL hemorrhoids.    CONTINUE YOUR WEIGHT LOSS EFFORTS. LOSE TEN POUNDS.  DRINK WATER TO KEEP YOUR URINE LIGHT YELLOW.  FOLLOW A HIGH FIBER DIET. AVOID ITEMS THAT CAUSE BLOATING & GAS. SEE INFO BELOW.  YOUR BIOPSY RESULTS WILL BE AVAILABLE IN MY CHART AFTER OCT 4 AND MY OFFICE WILL CONTACT YOU IN 10-14 DAYS WITH YOUR RESULTS.   PLEASE LET ME KNOW IF YOU WOULD LIKE A REFERRAL TO SURGERY TO FIX YOUR HEMORRHOIDS.  Next colonoscopy in 5-10 years.    Colonoscopy Care After Read the instructions outlined below and refer to this sheet in the next week. These discharge instructions provide you with general information on caring for yourself after you leave the hospital. While your treatment has been planned according to the most current medical practices available, unavoidable complications occasionally occur. If you have any problems or questions after discharge, call DR. Amauri Medellin, [phone].  ACTIVITY  You may resume your regular activity, but move at a slower pace for the next 24 hours.   Take frequent rest periods for the next 24 hours.   Walking will help get rid of the air and reduce the bloated feeling in your belly (abdomen).   No driving for 24 hours (because of the medicine (anesthesia) used during the test).   You may shower.   Do not sign any important legal documents or operate any machinery for 24 hours (because of the anesthesia used during the test).    NUTRITION  Drink plenty of fluids.   You may resume your normal diet as instructed by your doctor.   Begin with a light meal and progress to your normal diet. Heavy or fried foods are harder to digest and may make you feel sick to your stomach (nauseated).   Avoid alcoholic beverages for 24 hours or as instructed.    MEDICATIONS  You may resume your normal medications.   WHAT YOU CAN EXPECT TODAY  Some feelings of bloating in the abdomen.     Passage of more gas than usual.   Spotting of blood in your stool or on the toilet paper  .  IF YOU HAD POLYPS REMOVED DURING THE COLONOSCOPY:  Eat a soft diet IF YOU HAVE NAUSEA, BLOATING, ABDOMINAL PAIN, OR VOMITING.    FINDING OUT THE RESULTS OF YOUR TEST Not all test results are available during your visit. DR. Oneida Alar WILL CALL YOU WITHIN 14 DAYS OF YOUR PROCEDUE WITH YOUR RESULTS. Do not assume everything is normal if you have not heard from DR. Darby Fleeman, CALL HER OFFICE AT [phone].  SEEK IMMEDIATE MEDICAL ATTENTION AND CALL THE OFFICE: [phone] IF:  You have more than a spotting of blood in your stool.   Your belly is swollen (abdominal distention).   You are nauseated or vomiting.   You have a temperature over 101F.   You have abdominal pain or discomfort that is severe or gets worse throughout the day.   High-Fiber Diet A high-fiber diet changes your normal diet to include more whole grains, legumes, fruits, and vegetables. Changes in the diet involve replacing refined carbohydrates with unrefined foods. The calorie level of the diet is essentially unchanged. The Dietary Reference Intake (recommended amount) for adult males is 38 grams per day. For adult females, it is 25 grams per day. Pregnant and lactating women should consume 28 grams of fiber per day. Fiber is the intact part of a plant that is not  broken down during digestion. Functional fiber is fiber that has been isolated from the plant to provide a beneficial effect in the body. PURPOSE  Increase stool bulk.   Ease and regulate bowel movements.   Lower cholesterol.   REDUCE RISK OF COLON CANCER  INDICATIONS THAT YOU NEED MORE FIBER  Constipation and hemorrhoids.   Uncomplicated diverticulosis (intestine condition) and irritable bowel syndrome.   Weight management.   As a protective measure against hardening of the arteries (atherosclerosis), diabetes, and cancer.   GUIDELINES FOR  INCREASING FIBER IN THE DIET  Start adding fiber to the diet slowly. A gradual increase of about 5 more grams (2 slices of whole-wheat bread, 2 servings of most fruits or vegetables, or 1 bowl of high-fiber cereal) per day is best. Too rapid an increase in fiber may result in constipation, flatulence, and bloating.   Drink enough water and fluids to keep your urine clear or pale yellow. Water, juice, or caffeine-free drinks are recommended. Not drinking enough fluid may cause constipation.   Eat a variety of high-fiber foods rather than one type of fiber.   Try to increase your intake of fiber through using high-fiber foods rather than fiber pills or supplements that contain small amounts of fiber.   The goal is to change the types of food eaten. Do not supplement your present diet with high-fiber foods, but replace foods in your present diet.   INCLUDE A VARIETY OF FIBER SOURCES  Replace refined and processed grains with whole grains, canned fruits with fresh fruits, and incorporate other fiber sources. White rice, white breads, and most bakery goods contain little or no fiber.   Brown whole-grain rice, buckwheat oats, and many fruits and vegetables are all good sources of fiber. These include: broccoli, Brussels sprouts, cabbage, cauliflower, beets, sweet potatoes, white potatoes (skin on), carrots, tomatoes, eggplant, squash, berries, fresh fruits, and dried fruits.   Cereals appear to be the richest source of fiber. Cereal fiber is found in whole grains and bran. Bran is the fiber-rich outer coat of cereal grain, which is largely removed in refining. In whole-grain cereals, the bran remains. In breakfast cereals, the largest amount of fiber is found in those with "bran" in their names. The fiber content is sometimes indicated on the label.   You may need to include additional fruits and vegetables each day.   In baking, for 1 cup white flour, you may use the following substitutions:   1  cup whole-wheat flour minus 2 tablespoons.   1/2 cup white flour plus 1/2 cup whole-wheat flour.   Polyps, Colon  A polyp is extra tissue that grows inside your body. Colon polyps grow in the large intestine. The large intestine, also called the colon, is part of your digestive system. It is a long, hollow tube at the end of your digestive tract where your body makes and stores stool. Most polyps are not dangerous. They are benign. This means they are not cancerous. But over time, some types of polyps can turn into cancer. Polyps that are smaller than a pea are usually not harmful. But larger polyps could someday become or may already be cancerous. To be safe, doctors remove all polyps and test them.   PREVENTION There is not one sure way to prevent polyps. You might be able to lower your risk of getting them if you:  Eat more fruits and vegetables and less fatty food.   Do not smoke.   Avoid alcohol.   Exercise  every day.   Lose weight if you are overweight.   Eating more calcium and folate can also lower your risk of getting polyps. Some foods that are rich in calcium are milk, cheese, and broccoli. Some foods that are rich in folate are chickpeas, kidney beans, and spinach.   Hemorrhoids Hemorrhoids are dilated (enlarged) veins around the rectum. Sometimes clots will form in the veins. This makes them swollen and painful. These are called thrombosed hemorrhoids. Causes of hemorrhoids include:  Constipation.   Straining to have a bowel movement.   HEAVY LIFTING  HOME CARE INSTRUCTIONS  Eat a well balanced diet and drink 6 to 8 glasses of water every day to avoid constipation. You may also use a bulk laxative.   Avoid straining to have bowel movements.   Keep anal area dry and clean.   Do not use a donut shaped pillow or sit on the toilet for long periods. This increases blood pooling and pain.   Move your bowels when your body has the urge; this will require less  straining and will decrease pain and pressure.

## 2016-09-11 ENCOUNTER — Encounter (HOSPITAL_COMMUNITY): Payer: Self-pay | Admitting: Gastroenterology

## 2016-09-19 ENCOUNTER — Telehealth: Payer: Self-pay | Admitting: Gastroenterology

## 2016-09-19 ENCOUNTER — Other Ambulatory Visit: Payer: Self-pay

## 2016-09-19 DIAGNOSIS — K649 Unspecified hemorrhoids: Secondary | ICD-10-CM

## 2016-09-19 NOTE — Telephone Encounter (Signed)
Reminder in epic °

## 2016-09-19 NOTE — Telephone Encounter (Signed)
PT is aware and would like referral to surgeon for hemorrhoids.

## 2016-09-19 NOTE — Telephone Encounter (Signed)
Referral faxed to Dr. Jenkins office 

## 2016-09-19 NOTE — Telephone Encounter (Signed)
Please call pt. She had a simple adenoma removed from her colon.    CONTINUE YOUR WEIGHT LOSS EFFORTS. LOSE TEN POUNDS.  DRINK WATER TO KEEP YOUR URINE LIGHT YELLOW.  FOLLOW A HIGH FIBER DIET. AVOID ITEMS THAT CAUSE BLOATING & GAS.   PLEASE LET ME KNOW IF YOU WOULD LIKE A REFERRAL TO SURGERY TO FIX YOUR HEMORRHOIDS.  Next colonoscopy in 5-10 years.

## 2016-09-20 ENCOUNTER — Telehealth: Payer: Self-pay

## 2016-09-20 NOTE — Telephone Encounter (Signed)
LMOM and VM to inform pt of appt with Dr. Rosana Hoes (surgeon for hemorrhoids) 10/10/16 at 2:30 pm.

## 2016-09-20 NOTE — Telephone Encounter (Signed)
REVIEWED-NO ADDITIONAL RECOMMENDATIONS. 

## 2016-09-20 NOTE — Telephone Encounter (Signed)
Pt called office. Informed of appt with Dr. Rosana Hoes for 10/10/16 at 2:30 pm.

## 2016-09-20 NOTE — Telephone Encounter (Signed)
See note

## 2016-11-14 ENCOUNTER — Other Ambulatory Visit: Payer: Self-pay | Admitting: Obstetrics and Gynecology

## 2016-11-14 DIAGNOSIS — N83201 Unspecified ovarian cyst, right side: Secondary | ICD-10-CM

## 2016-11-15 ENCOUNTER — Ambulatory Visit (INDEPENDENT_AMBULATORY_CARE_PROVIDER_SITE_OTHER): Payer: BLUE CROSS/BLUE SHIELD | Admitting: Obstetrics and Gynecology

## 2016-11-15 ENCOUNTER — Other Ambulatory Visit: Payer: Self-pay | Admitting: Obstetrics and Gynecology

## 2016-11-15 ENCOUNTER — Ambulatory Visit (INDEPENDENT_AMBULATORY_CARE_PROVIDER_SITE_OTHER): Payer: BLUE CROSS/BLUE SHIELD

## 2016-11-15 ENCOUNTER — Encounter (INDEPENDENT_AMBULATORY_CARE_PROVIDER_SITE_OTHER): Payer: Self-pay

## 2016-11-15 ENCOUNTER — Encounter: Payer: Self-pay | Admitting: Obstetrics and Gynecology

## 2016-11-15 VITALS — BP 140/88 | HR 78 | Wt 189.0 lb

## 2016-11-15 DIAGNOSIS — N83201 Unspecified ovarian cyst, right side: Secondary | ICD-10-CM | POA: Diagnosis not present

## 2016-11-15 DIAGNOSIS — N83202 Unspecified ovarian cyst, left side: Secondary | ICD-10-CM

## 2016-11-15 NOTE — Progress Notes (Addendum)
Patient ID: MARCINDA Boyd, female   DOB: [birth year], 60 y.o.   MRN: [id number]   [facility] Visit  @DATE @            Patient name: Dana Boyd MRN [id number]  Date of birth: February 04, 1956  CC & HPI:   Chief Complaint  Patient presents with  . Follow-up    u/s for right ovarian cyst     Dana Boyd is a 60 y.o. female presenting today for f/u of pelvic, and transvaginal US, art/ven flow abd/pelv doppler today. Pt is currently being followed for a right ovarian cyst.    ROS:  ROS No complaints, f/u for [place] results The patient's husband reports that the recent evaluation of the ovarian mass may very difficult for them to achieve insurance for this year and he asked for advice regarding how to obtain better insurance next year and be sure that the benign nature of the cyst is understood by the insurance companies. We discussed this and the patient is for family and the patient's husband will let us know if you need additional supportive assistance from [place] when she applies for insurance in the future  Pertinent History Reviewed:   Reviewed: Significant for abdominal hysterectomy, cesarean section  Medical         Past Medical History:  Diagnosis Date  . Hyperlipidemia   . Hypertension   . Obesity                               Surgical Hx:    Past Surgical History:  Procedure Laterality Date  . ABDOMINAL HYSTERECTOMY  1994   left the ovaries  . BREAST BIOPSY Right   . CESAREAN SECTION    . COLONOSCOPY N/A 09/03/2016   Procedure: COLONOSCOPY;  Surgeon: [name], MD;  Location: [facility];  Service: Endoscopy;  Laterality: N/A;  11:15 Am   Medications: Reviewed & Updated - see associated section                       Current Outpatient Prescriptions:  .  hydrochlorothiazide (HYDRODIURIL) 25 MG tablet, Take 25 mg by mouth daily., Disp: , Rfl:  .  NON FORMULARY, Vitamin D  1000 IU   One daily, Disp: , Rfl:  .  Omega-3 Fatty Acids (FISH OIL) 1200 MG CPDR, Take by  mouth., Disp: , Rfl:  .  atorvastatin (LIPITOR) 20 MG tablet, Take 20 mg by mouth daily. Reported on 02/07/2016, Disp: , Rfl:    Social History: Reviewed -  reports that she has quit smoking. Her smoking use included Cigarettes. She quit after 15.00 years of use. She has never used smokeless tobacco.  Objective Findings:  Vitals: Blood pressure 140/88, pulse 78, weight 189 lb (85.7 kg).  Physical Examination: Discussion only   [place] results:  Transvaginal US on 05/16/16: simple right ov cyst 5.1 x 4.6 x 3.3 cm w/arterial and venous flow seen,normal lt ov(ov's appear to be mobile),no free fluid,no pain during ultrasound.  Transvaginal US today: simple right ov cyst 4.44 x 4.59 x 3.07 cm w/o venous or arterial flow seen, 34 cc in volume  See  interpretation in ultrasound report itself   Assessment & Plan:   A:  1. F/u for [place] results  2. Benign simple right adnexal cyst, no gross change in size from [place] 6 months ago, findings reassuring   P:  1. Repeat US in 6 months After which no further monitoring is planned We'll work to assist family in applying for insurance and future  By signing my name below, I, Hansel Feinstein, attest that this documentation has been prepared under the direction and in the presence of Jonnie Kind, MD. Electronically Signed: Hansel Feinstein, ED Scribe. 11/15/16. 10:14 AM.  I personally performed the services described in this documentation, which was SCRIBED in my presence. The recorded information has been reviewed and considered accurate. It has been edited as necessary during review. Jonnie Kind, MD

## 2016-11-15 NOTE — Progress Notes (Addendum)
PELVIC TA/TV: Normal vag cuff,simple right ovarian cyst 4.7 x 3.2 x 2.9 cm  (no significant change),arterial and venous flow seen,unable to visualize left ovary,no free fluid,right ovary appears mobile.

## 2017-05-02 ENCOUNTER — Other Ambulatory Visit: Payer: Self-pay | Admitting: Obstetrics and Gynecology

## 2017-05-02 DIAGNOSIS — N83201 Unspecified ovarian cyst, right side: Secondary | ICD-10-CM

## 2017-05-16 ENCOUNTER — Ambulatory Visit: Payer: BLUE CROSS/BLUE SHIELD | Admitting: Obstetrics and Gynecology

## 2017-05-16 ENCOUNTER — Other Ambulatory Visit: Payer: BLUE CROSS/BLUE SHIELD

## 2017-05-17 ENCOUNTER — Ambulatory Visit (INDEPENDENT_AMBULATORY_CARE_PROVIDER_SITE_OTHER): Payer: BLUE CROSS/BLUE SHIELD | Admitting: Obstetrics and Gynecology

## 2017-05-17 ENCOUNTER — Encounter: Payer: Self-pay | Admitting: Obstetrics and Gynecology

## 2017-05-17 ENCOUNTER — Ambulatory Visit (INDEPENDENT_AMBULATORY_CARE_PROVIDER_SITE_OTHER): Payer: BLUE CROSS/BLUE SHIELD

## 2017-05-17 VITALS — BP 120/80 | HR 63 | Wt 193.0 lb

## 2017-05-17 DIAGNOSIS — N83209 Unspecified ovarian cyst, unspecified side: Secondary | ICD-10-CM | POA: Diagnosis not present

## 2017-05-17 DIAGNOSIS — N83201 Unspecified ovarian cyst, right side: Secondary | ICD-10-CM | POA: Diagnosis not present

## 2017-05-17 NOTE — Progress Notes (Signed)
PELVIC US TA/TV: normal vaginal cuff,normal right ovary,resolved right ovarian cyst,simple left ovarian cyst 1.7 x 1.1 x 1.8 cm,no free fluid,ovaries appear mobile,no pain during ultrasound

## 2017-05-17 NOTE — Progress Notes (Signed)
[facility] Visit  05/17/2017            Patient name: Dana Boyd MRN [id number]  Date of birth: 07-16-1956  CC & HPI:  Dana Boyd is a 61 y.o. female presenting today for follow up concerning [place] results. She is currently being followed for right ovarian cyst. She has no acute complaints or symptoms at this time. Patient had a 4 cm cyst noted 1 year ago and followed up 6 months ago unchanged.  ROS:  ROS Otherwise negative for acute change except as noted in the HPI.   Pertinent History Reviewed:   Reviewed: Significant for abdominal hysterectomy  Medical         Past Medical History:  Diagnosis Date  . Hyperlipidemia   . Hypertension   . Obesity                               Surgical Hx:    Past Surgical History:  Procedure Laterality Date  . ABDOMINAL HYSTERECTOMY  1994   left the ovaries  . BREAST BIOPSY Right   . CESAREAN SECTION    . COLONOSCOPY N/A 09/03/2016   Procedure: COLONOSCOPY;  Surgeon: [name], MD;  Location: [facility];  Service: Endoscopy;  Laterality: N/A;  11:15 Am   Medications: Reviewed & Updated - see associated section                       Current Outpatient Prescriptions:  .  atorvastatin (LIPITOR) 20 MG tablet, Take 20 mg by mouth daily. Reported on 02/07/2016, Disp: , Rfl:  .  hydrochlorothiazide (HYDRODIURIL) 25 MG tablet, Take 25 mg by mouth daily., Disp: , Rfl:  .  NON FORMULARY, Vitamin D  1000 IU   One daily, Disp: , Rfl:  .  Omega-3 Fatty Acids (FISH OIL) 1200 MG CPDR, Take by mouth., Disp: , Rfl:    Social History: Reviewed -  reports that she has quit smoking. Her smoking use included Cigarettes. She quit after 15.00 years of use. She has never used smokeless tobacco.  Objective Findings:  Vitals: Blood pressure 120/80, pulse 63, weight 193 lb (87.5 kg).  Physical Examination: General appearance - alert, well appearing, and in no distress Mental status - alert, oriented to person, place, and time GYNECOLOGIC  SONOGRAM   Dana Boyd is a 61 y.o. s/p hysterectomy,she is here  for a pelvic sonogram to f/u right ovarian cyst.  Uterus                     Surgically removed,normal vaginal cuff  Endometrium          N/A        Right ovary             2.1 x 1.2 x 1.8 cm, wnl  Left ovary                1.7 x 1.1 x 1.8 cm, simple left ovarian cyst 1.7 x 1.1 x 1.8 cm  No free fluid   Technician Comments:  PELVIC US TA/TV: normal vaginal cuff,normal right ovary,resolved right ovarian cyst,simple left ovarian cyst 1.7 x 1.1 x 1.8 cm,no free fluid,ovaries appear mobile,no pain during ultrasound    [employer] 05/17/2017 The provider spent over 10 minutes with the visit, including pre visit review, documentation, with >than 50% spent in  updating patient with [place] results and counseling/coordination of care.  Assessment & Plan:   A:  1.  Resolved post-menopausal cyst  P:  1. Follow up PRN or in 3 years     By signing my name below, I, Evelene Croon, attest that this documentation has been prepared under the direction and in the presence of Jonnie Kind, MD . Electronically Signed: Evelene Croon, Scribe. 05/17/2017. 11:57 AM. I personally performed the services described in this documentation, which was SCRIBED in my presence. The recorded information has been reviewed and considered accurate. It has been edited as necessary during review. Jonnie Kind, MD

## 2017-07-12 ENCOUNTER — Other Ambulatory Visit (HOSPITAL_COMMUNITY): Payer: Self-pay | Admitting: Internal Medicine

## 2017-07-12 DIAGNOSIS — Z1231 Encounter for screening mammogram for malignant neoplasm of breast: Secondary | ICD-10-CM

## 2017-08-19 ENCOUNTER — Ambulatory Visit (HOSPITAL_COMMUNITY)
Admission: RE | Admit: 2017-08-19 | Discharge: 2017-08-19 | Disposition: A | Discharge status: Home / Self Care | Payer: BLUE CROSS/BLUE SHIELD | Source: Ambulatory Visit | Attending: Internal Medicine | Admitting: Internal Medicine

## 2017-08-19 DIAGNOSIS — Z1231 Encounter for screening mammogram for malignant neoplasm of breast: Secondary | ICD-10-CM

## 2018-08-29 ENCOUNTER — Other Ambulatory Visit (HOSPITAL_COMMUNITY): Payer: Self-pay | Admitting: Internal Medicine

## 2018-08-29 DIAGNOSIS — Z1231 Encounter for screening mammogram for malignant neoplasm of breast: Secondary | ICD-10-CM

## 2018-09-10 ENCOUNTER — Ambulatory Visit (HOSPITAL_COMMUNITY): Payer: BLUE CROSS/BLUE SHIELD

## 2018-09-11 ENCOUNTER — Ambulatory Visit (HOSPITAL_COMMUNITY)
Admission: RE | Admit: 2018-09-11 | Discharge: 2018-09-11 | Disposition: A | Discharge status: Home / Self Care | Payer: No Typology Code available for payment source | Source: Ambulatory Visit | Attending: Internal Medicine | Admitting: Internal Medicine

## 2018-09-11 ENCOUNTER — Encounter (HOSPITAL_COMMUNITY): Payer: Self-pay

## 2018-09-11 DIAGNOSIS — Z1231 Encounter for screening mammogram for malignant neoplasm of breast: Secondary | ICD-10-CM | POA: Diagnosis not present

## 2019-09-03 ENCOUNTER — Other Ambulatory Visit (HOSPITAL_COMMUNITY): Payer: Self-pay | Admitting: Obstetrics and Gynecology

## 2019-09-03 DIAGNOSIS — Z1231 Encounter for screening mammogram for malignant neoplasm of breast: Secondary | ICD-10-CM

## 2019-09-21 ENCOUNTER — Other Ambulatory Visit: Payer: Self-pay

## 2019-09-21 ENCOUNTER — Ambulatory Visit (HOSPITAL_COMMUNITY)
Admission: RE | Admit: 2019-09-21 | Discharge: 2019-09-21 | Disposition: A | Discharge status: Home / Self Care | Payer: PRIVATE HEALTH INSURANCE | Source: Ambulatory Visit | Attending: Obstetrics and Gynecology | Admitting: Obstetrics and Gynecology

## 2019-09-21 DIAGNOSIS — Z1231 Encounter for screening mammogram for malignant neoplasm of breast: Secondary | ICD-10-CM | POA: Insufficient documentation

## 2019-12-02 ENCOUNTER — Telehealth: Payer: Self-pay | Admitting: Adult Health

## 2019-12-02 NOTE — Telephone Encounter (Signed)
Called patient regarding appointment and the following message was left:   We have you scheduled for an upcoming appointment at our office. At this time, we are still not allowing visitors during the appointment, however, a support person, over age 64, may accompany you to your appointment if assistance is needed for safety or care concerns. Otherwise, support persons should remain outside until the visit is complete.   We ask if you are sick, have any symptoms of COVID, have had any exposure to anyone suspected or confirmed of having COVID-19, or are awaiting test results for COVID-19, to call our office as we may need to reschedule you for a virtual visit or schedule your appointment for a later date.    Please know we will ask you these questions or similar questions when you arrive for your appointment and again its how we are keeping everyone safe.    Also,to keep you safe, please use the provided hand sanitizer when you enter the office. We are asking everyone in the office to wear a mask to help prevent the spread of germs. If you have a mask of your own, please wear it to your appointment, if not, we are happy to provide one for you.  Thank you for understanding and your cooperation.    [facility] Staff

## 2019-12-07 ENCOUNTER — Ambulatory Visit (INDEPENDENT_AMBULATORY_CARE_PROVIDER_SITE_OTHER): Payer: PRIVATE HEALTH INSURANCE | Admitting: Adult Health

## 2019-12-07 ENCOUNTER — Encounter: Payer: Self-pay | Admitting: Adult Health

## 2019-12-07 ENCOUNTER — Other Ambulatory Visit: Payer: Self-pay

## 2019-12-07 VITALS — BP 155/82 | HR 94 | Ht 62.0 in | Wt 202.0 lb

## 2019-12-07 DIAGNOSIS — K649 Unspecified hemorrhoids: Secondary | ICD-10-CM | POA: Insufficient documentation

## 2019-12-07 DIAGNOSIS — N898 Other specified noninflammatory disorders of vagina: Secondary | ICD-10-CM | POA: Insufficient documentation

## 2019-12-07 DIAGNOSIS — Z1211 Encounter for screening for malignant neoplasm of colon: Secondary | ICD-10-CM | POA: Diagnosis not present

## 2019-12-07 DIAGNOSIS — Z01419 Encounter for gynecological examination (general) (routine) without abnormal findings: Secondary | ICD-10-CM

## 2019-12-07 LAB — HEMOCCULT GUIAC POC 1CARD (OFFICE): Fecal Occult Blood, POC: NEGATIVE

## 2019-12-07 NOTE — Progress Notes (Signed)
Patient ID: Dana Boyd, female   DOB: 06-16-1956, 64 y.o.   MRN: [id number] History of Present Illness: Dana Boyd is a 64 year old black female, married, sp hysterectomy in for a well woman gyn exam. PCP is Dr Gerarda Fraction   Current Medications, Allergies, Past Medical History, Past Surgical History, Family History and Social History were reviewed in [facility] record.     Review of Systems: Patient denies any headaches, hearing loss, fatigue, blurred vision, shortness of breath, chest pain, abdominal pain, problems with bowel movements, urination, or intercourse. No joint pain or mood swings. She walks 6 miles most days for last 2 years.   Physical Exam:BP (!) 155/82 (BP Location: Left Arm, Patient Position: Sitting, Cuff Size: Large)   Pulse 94   Ht 5\' 2"  (1.575 m)   Wt 202 lb (91.6 kg)   BMI 36.95 kg/m  General:  Well developed, well nourished, no acute distress Skin:  Warm and dry Neck:  Midline trachea, normal thyroid, good ROM, no lymphadenopathy,no carotid bruits heard  Lungs; Clear to auscultation bilaterally Breast:  No dominant palpable mass, retraction, or nipple discharge Cardiovascular: Regular rate and rhythm Abdomen:  Soft, non tender, no hepatosplenomegaly Pelvic:  External genitalia is normal in appearance, no lesions.  The vagina is pale with loss of moisture and rugae, has area of granulation tissue at vaginal cuff and small growth posterior vaginal wall, co exam with Dr Elonda Husky.(no bleeding after sex or pain) . Urethra has no lesions or masses. The cervix and uterus are absent.  No adnexal masses or tenderness noted.Bladder is non tender, no masses felt. Has AK left inner thigh Rectal: Good sphincter tone, no polyps, + hemorrhoids felt.  Hemoccult negative. Extremities/musculoskeletal:  No swelling or varicosities noted, no clubbing or cyanosis Psych:  No mood changes, alert and cooperative,seems happy Fall risk is low PHQ 2 score is 0. Examination  chaperoned by Diona Fanti CMA  Impression and Plan: 1. Encounter for well woman exam with routine gynecological exam Physical in 1 year Mammogram yearly Labs with PCP   2. Screening for colorectal cancer Colonoscopy per GI  3. Granulation tissue of vaginal cuff Will follow for now  4. Hemorrhoids, unspecified hemorrhoid type

## 2020-01-16 ENCOUNTER — Ambulatory Visit: Payer: PRIVATE HEALTH INSURANCE

## 2020-02-13 ENCOUNTER — Ambulatory Visit: Payer: PRIVATE HEALTH INSURANCE | Attending: Internal Medicine

## 2020-02-13 DIAGNOSIS — Z23 Encounter for immunization: Secondary | ICD-10-CM

## 2020-02-13 NOTE — Progress Notes (Signed)
   Covid-19 Vaccination Clinic  Name:  Dana Boyd    MRN: [id number] DOB: 1956/08/21  02/13/2020  Dana Boyd was observed post Covid-19 immunization for 15 minutes without incident. She was provided with Vaccine Information Sheet and instruction to access the V-Safe system.   Dana Boyd was instructed to call 911 with any severe reactions post vaccine: Marland Kitchen Difficulty breathing  . Swelling of face and throat  . A fast heartbeat  . A bad rash all over body  . Dizziness and weakness   Immunizations Administered    Name Date Dose VIS Date Route   Moderna COVID-19 Vaccine 02/13/2020 11:34 AM 0.5 mL 11/03/2019 Intramuscular   Manufacturer: Moderna   Lot: [id number]   [place]:[id number]

## 2020-03-16 ENCOUNTER — Ambulatory Visit: Payer: PRIVATE HEALTH INSURANCE | Attending: Internal Medicine

## 2020-03-16 DIAGNOSIS — Z23 Encounter for immunization: Secondary | ICD-10-CM

## 2020-03-16 NOTE — Progress Notes (Signed)
   Covid-19 Vaccination Clinic  Name:  Dana Boyd    MRN: [id number] DOB: [birth year]  03/16/2020  [name] was observed post Covid-19 immunization for 15 minutes without incident. She was provided with Vaccine Information Sheet and instruction to access the V-Safe system.   [name] was instructed to call 911 with any severe reactions post vaccine: Marland Kitchen Difficulty breathing  . Swelling of face and throat  . A fast heartbeat  . A bad rash all over body  . Dizziness and weakness   Immunizations Administered    Name Date Dose VIS Date Route   Moderna COVID-19 Vaccine 03/16/2020 11:05 AM 0.5 mL 11/03/2019 Intramuscular   Manufacturer: Moderna   Lot: [id number]   [id number]

## 2020-08-22 ENCOUNTER — Other Ambulatory Visit (HOSPITAL_COMMUNITY): Payer: Self-pay | Admitting: Obstetrics and Gynecology

## 2020-08-22 DIAGNOSIS — Z1231 Encounter for screening mammogram for malignant neoplasm of breast: Secondary | ICD-10-CM

## 2020-09-23 ENCOUNTER — Other Ambulatory Visit: Payer: Self-pay

## 2020-09-23 ENCOUNTER — Ambulatory Visit (HOSPITAL_COMMUNITY)
Admission: RE | Admit: 2020-09-23 | Discharge: 2020-09-23 | Disposition: A | Discharge status: Home / Self Care | Payer: Medicare HMO | Source: Ambulatory Visit | Attending: Obstetrics and Gynecology | Admitting: Obstetrics and Gynecology

## 2020-09-23 DIAGNOSIS — Z1231 Encounter for screening mammogram for malignant neoplasm of breast: Secondary | ICD-10-CM | POA: Diagnosis present

## 2020-12-06 ENCOUNTER — Other Ambulatory Visit (HOSPITAL_COMMUNITY): Payer: Self-pay | Admitting: Physician Assistant

## 2020-12-06 DIAGNOSIS — E782 Mixed hyperlipidemia: Secondary | ICD-10-CM | POA: Diagnosis not present

## 2020-12-06 DIAGNOSIS — Z6837 Body mass index (BMI) 37.0-37.9, adult: Secondary | ICD-10-CM | POA: Diagnosis not present

## 2020-12-06 DIAGNOSIS — E7849 Other hyperlipidemia: Secondary | ICD-10-CM | POA: Diagnosis not present

## 2020-12-06 DIAGNOSIS — Z0000 Encounter for general adult medical examination without abnormal findings: Secondary | ICD-10-CM | POA: Diagnosis not present

## 2020-12-06 DIAGNOSIS — E2839 Other primary ovarian failure: Secondary | ICD-10-CM

## 2020-12-06 DIAGNOSIS — Z0001 Encounter for general adult medical examination with abnormal findings: Secondary | ICD-10-CM | POA: Diagnosis not present

## 2020-12-06 DIAGNOSIS — I10 Essential (primary) hypertension: Secondary | ICD-10-CM | POA: Diagnosis not present

## 2020-12-06 DIAGNOSIS — Z23 Encounter for immunization: Secondary | ICD-10-CM | POA: Diagnosis not present

## 2020-12-06 DIAGNOSIS — R7309 Other abnormal glucose: Secondary | ICD-10-CM | POA: Diagnosis not present

## 2020-12-13 ENCOUNTER — Ambulatory Visit (HOSPITAL_COMMUNITY)
Admission: RE | Admit: 2020-12-13 | Discharge: 2020-12-13 | Disposition: A | Discharge status: Home / Self Care | Payer: Medicare HMO | Source: Ambulatory Visit | Attending: Physician Assistant | Admitting: Physician Assistant

## 2020-12-13 ENCOUNTER — Other Ambulatory Visit: Payer: Self-pay

## 2020-12-13 DIAGNOSIS — E2839 Other primary ovarian failure: Secondary | ICD-10-CM | POA: Diagnosis not present

## 2020-12-13 DIAGNOSIS — Z780 Asymptomatic menopausal state: Secondary | ICD-10-CM | POA: Diagnosis not present

## 2020-12-13 DIAGNOSIS — Z90710 Acquired absence of both cervix and uterus: Secondary | ICD-10-CM | POA: Diagnosis not present

## 2021-01-09 ENCOUNTER — Encounter: Payer: Self-pay | Admitting: Adult Health

## 2021-01-09 ENCOUNTER — Other Ambulatory Visit: Payer: Self-pay

## 2021-01-09 ENCOUNTER — Ambulatory Visit (INDEPENDENT_AMBULATORY_CARE_PROVIDER_SITE_OTHER): Payer: Medicare HMO | Admitting: Adult Health

## 2021-01-09 VITALS — BP 166/92 | HR 93 | Ht 61.5 in | Wt 204.5 lb

## 2021-01-09 DIAGNOSIS — K649 Unspecified hemorrhoids: Secondary | ICD-10-CM

## 2021-01-09 DIAGNOSIS — Z01419 Encounter for gynecological examination (general) (routine) without abnormal findings: Secondary | ICD-10-CM

## 2021-01-09 DIAGNOSIS — N898 Other specified noninflammatory disorders of vagina: Secondary | ICD-10-CM | POA: Diagnosis not present

## 2021-01-09 DIAGNOSIS — Z1211 Encounter for screening for malignant neoplasm of colon: Secondary | ICD-10-CM | POA: Diagnosis not present

## 2021-01-09 LAB — HEMOCCULT GUIAC POC 1CARD (OFFICE): Fecal Occult Blood, POC: NEGATIVE

## 2021-01-09 NOTE — Progress Notes (Signed)
Patient ID: Dana Boyd, female   DOB: 08-26-1956, 65 y.o.   MRN: [id number] History of Present Illness: Dana Boyd is a 65 year old black female, married, sp hysterectomy in for well woman gyn exa. PCP is Dana Boyd.    Current Medications, Allergies, Past Medical History, Past Surgical History, Family History and Social History were reviewed in [facility] record.     Review of Systems: Patient denies any headaches, hearing loss, fatigue, blurred vision, shortness of breath, chest pain, abdominal pain, problems with bowel movements, urination, or intercourse. No joint pain or mood swings.    Physical Exam:BP (!) 166/92 (BP Location: Right Arm, Patient Position: Standing, Cuff Size: Large)   Pulse 93   Ht 5' 1.5" (1.562 m)   Wt 204 lb 8 oz (92.8 kg)   BMI 38.01 kg/m  General:  Well developed, well nourished, no acute distress Skin:  Warm and dry Neck:  Midline trachea, normal thyroid, good ROM, no lymphadenopathy,no carotid bruits heard. Lungs; Clear to auscultation bilaterally Breast:  No dominant palpable mass, retraction, or nipple discharge Cardiovascular: Regular rate and rhythm Abdomen:  Soft, non tender, no hepatosplenomegaly Pelvic:  External genitalia is normal in appearance, no lesions.  The vagina is pale pin with loss of moisture and rugae, has 2 cm ?polyp vs granulation tissue at vaginal cuff on posterior wall, it is non friable and non tender, but I think it is growing, Dana Boyd in for co exam. Urethra has no lesions or masses. The cervix and uterus are absent. No adnexal masses or tenderness noted.Bladder is non tender, no masses felt. Rectal: Good sphincter tone, no polyps, +internal and external  hemorrhoids felt.  Hemoccult negative. Extremities/musculoskeletal:  No swelling or varicosities noted, no clubbing or cyanosis Psych:  No mood changes, alert and cooperative,seems happy AA is 1 Fall risk is low PHQ 9 score is 0 GAD 7 score is 0  [place]  - 01/09/21 1141      Pregnancy Intention Screening   Does the patient want to become pregnant in the next year? N/A    Does the patient's partner want to become pregnant in the next year? N/A    Would the patient like to discuss contraceptive options today? N/A      Contraception Wrap Up   Current Method Female Sterilization   hyst   End Method Female Sterilization   hyst   Contraception Counseling Provided No         Examination chaperoned by Dana Pupa LPN.   Impression and Plan:  1. Encounter for well woman exam with routine gynecological exam Physical in 1 year Mammogram yearly,had negative mammogram 09/23/20 Labs with PCP Colonoscopy in 2027 Has had 2 COVID vaccines  Dexa was normal 12/13/20.   2. Encounter for screening fecal occult blood testing  3. Granulation tissue of vaginal cuff Return in 1 week for removal with Dana Boyd   4. Hemorrhoids, unspecified hemorrhoid type

## 2021-01-16 ENCOUNTER — Other Ambulatory Visit: Payer: Self-pay | Admitting: Obstetrics & Gynecology

## 2021-01-16 ENCOUNTER — Ambulatory Visit: Payer: Medicare HMO | Admitting: Obstetrics & Gynecology

## 2021-01-16 ENCOUNTER — Other Ambulatory Visit: Payer: Self-pay

## 2021-01-16 ENCOUNTER — Encounter: Payer: Self-pay | Admitting: Obstetrics & Gynecology

## 2021-01-16 VITALS — BP 125/73 | HR 80 | Ht 61.5 in | Wt 204.0 lb

## 2021-01-16 DIAGNOSIS — N898 Other specified noninflammatory disorders of vagina: Secondary | ICD-10-CM | POA: Diagnosis not present

## 2021-01-16 DIAGNOSIS — N842 Polyp of vagina: Secondary | ICD-10-CM | POA: Diagnosis not present

## 2021-01-16 NOTE — Progress Notes (Signed)
    PROCEDURE NOTE  PRE-OP DIAGNOSIS:  Vaginal polyp, granulation, at vaginal cuff  PROCEDURE:  Vaginal polyp  INDICATIONS:  Dana Boyd is a 65 y.o. female who presents for minor skin surgery.  The patient understands all risks, benefits, indications, potential complications, and alternatives, and freely consents for the procedure.  The patient also understands the option of performing no surgery, the risk for scarring, and the technique of the procedure.  ANESTHESIA:  none  TECHNIQUE:   The polyp was identified at the left vaginal cuff edge A ring forcep was used to grasp the polyp and a cervical biopsy forcep was used to remove the polyp in 2 bites. Hemostasis was achieved with Monsel's solution  EBL 10 cc  No discomfort  Will send to pathology for evaluation Will call pt or send MyChart message with results    ICD-10-CM   1. Vaginal polyp  N84.2    removed today     Florian Buff, MD 01/16/2021 10:44 AM

## 2021-03-07 DIAGNOSIS — Z23 Encounter for immunization: Secondary | ICD-10-CM | POA: Diagnosis not present

## 2021-03-07 DIAGNOSIS — E119 Type 2 diabetes mellitus without complications: Secondary | ICD-10-CM | POA: Diagnosis not present

## 2021-03-07 DIAGNOSIS — I10 Essential (primary) hypertension: Secondary | ICD-10-CM | POA: Diagnosis not present

## 2021-03-07 DIAGNOSIS — Z6837 Body mass index (BMI) 37.0-37.9, adult: Secondary | ICD-10-CM | POA: Diagnosis not present

## 2021-05-04 DIAGNOSIS — H2512 Age-related nuclear cataract, left eye: Secondary | ICD-10-CM | POA: Diagnosis not present

## 2021-07-18 DIAGNOSIS — Z0100 Encounter for examination of eyes and vision without abnormal findings: Secondary | ICD-10-CM | POA: Diagnosis not present

## 2021-08-09 DIAGNOSIS — G8929 Other chronic pain: Secondary | ICD-10-CM | POA: Diagnosis not present

## 2021-08-09 DIAGNOSIS — Z008 Encounter for other general examination: Secondary | ICD-10-CM | POA: Diagnosis not present

## 2021-08-09 DIAGNOSIS — E669 Obesity, unspecified: Secondary | ICD-10-CM | POA: Diagnosis not present

## 2021-08-09 DIAGNOSIS — Z803 Family history of malignant neoplasm of breast: Secondary | ICD-10-CM | POA: Diagnosis not present

## 2021-08-09 DIAGNOSIS — R609 Edema, unspecified: Secondary | ICD-10-CM | POA: Diagnosis not present

## 2021-08-09 DIAGNOSIS — Z6837 Body mass index (BMI) 37.0-37.9, adult: Secondary | ICD-10-CM | POA: Diagnosis not present

## 2021-08-09 DIAGNOSIS — Z8249 Family history of ischemic heart disease and other diseases of the circulatory system: Secondary | ICD-10-CM | POA: Diagnosis not present

## 2021-08-09 DIAGNOSIS — E785 Hyperlipidemia, unspecified: Secondary | ICD-10-CM | POA: Diagnosis not present

## 2021-08-09 DIAGNOSIS — Z87891 Personal history of nicotine dependence: Secondary | ICD-10-CM | POA: Diagnosis not present

## 2021-08-09 DIAGNOSIS — R030 Elevated blood-pressure reading, without diagnosis of hypertension: Secondary | ICD-10-CM | POA: Diagnosis not present

## 2021-08-10 ENCOUNTER — Encounter: Payer: Self-pay | Admitting: *Deleted

## 2021-08-10 DIAGNOSIS — Z23 Encounter for immunization: Secondary | ICD-10-CM | POA: Diagnosis not present

## 2021-08-21 ENCOUNTER — Other Ambulatory Visit (HOSPITAL_COMMUNITY): Payer: Self-pay | Admitting: Obstetrics & Gynecology

## 2021-08-21 DIAGNOSIS — Z1231 Encounter for screening mammogram for malignant neoplasm of breast: Secondary | ICD-10-CM

## 2021-09-06 ENCOUNTER — Encounter: Payer: Self-pay | Admitting: Internal Medicine

## 2021-09-25 ENCOUNTER — Ambulatory Visit (HOSPITAL_COMMUNITY)
Admission: RE | Admit: 2021-09-25 | Discharge: 2021-09-25 | Disposition: A | Discharge status: Home / Self Care | Payer: Medicare HMO | Source: Ambulatory Visit | Attending: Obstetrics & Gynecology | Admitting: Obstetrics & Gynecology

## 2021-09-25 ENCOUNTER — Other Ambulatory Visit: Payer: Self-pay

## 2021-09-25 DIAGNOSIS — Z1231 Encounter for screening mammogram for malignant neoplasm of breast: Secondary | ICD-10-CM | POA: Insufficient documentation

## 2021-10-11 ENCOUNTER — Ambulatory Visit (INDEPENDENT_AMBULATORY_CARE_PROVIDER_SITE_OTHER): Payer: Self-pay | Admitting: *Deleted

## 2021-10-11 ENCOUNTER — Other Ambulatory Visit: Payer: Self-pay

## 2021-10-11 ENCOUNTER — Encounter: Payer: Self-pay | Admitting: *Deleted

## 2021-10-11 VITALS — Ht 63.0 in | Wt 203.0 lb

## 2021-10-11 DIAGNOSIS — Z86010 Personal history of colonic polyps: Secondary | ICD-10-CM

## 2021-10-11 MED ORDER — PEG 3350-KCL-NA BICARB-NACL 420 G PO SOLR: 4000.0000 mL | Freq: Once | ORAL | 0 refills | Status: AC

## 2021-10-11 NOTE — Progress Notes (Signed)
Gastroenterology Pre-Procedure Review  Request Date: 10/11/2021 Requesting Physician: 5 year recall, Last TCS 09/03/2016 done by Dr. Oneida Alar, tubular adenoma  PATIENT REVIEW QUESTIONS: The patient responded to the following health history questions as indicated:    1. Diabetes Melitis: no 2. Joint replacements in the past 12 months: no 3. Major health problems in the past 3 months: no 4. Has an artificial valve or MVP: no 5. Has a defibrillator: no 6. Has been advised in past to take antibiotics in advance of a procedure like teeth cleaning: no 7. Family history of colon cancer:  no 8. Alcohol Use: no 9. Illicit drug Use: no 10. History of sleep apnea:  no 11. History of coronary artery or other vascular stents placed within the last 12 months: no 12. History of any prior anesthesia complications: yes, slow to wake 13. Body mass index is 35.96 kg/m.    MEDICATIONS & ALLERGIES:    Patient reports the following regarding taking any blood thinners:   Plavix? no Aspirin? no Coumadin? no Brilinta? no Xarelto? no Eliquis? no Pradaxa? no Savaysa? no Effient? no  Patient confirms/reports the following medications:  Current Outpatient Medications  Medication Sig Dispense Refill   atorvastatin (LIPITOR) 20 MG tablet Take 20 mg by mouth daily. Reported on 02/07/2016     cholecalciferol (VITAMIN D3) 25 MCG (1000 UNIT) tablet Take 1,000 Units by mouth daily.     hydrochlorothiazide (HYDRODIURIL) 25 MG tablet Take 25 mg by mouth daily.     Omega-3 Fatty Acids (FISH OIL) 1200 MG CPDR Take by mouth daily at 6 (six) AM.     No current facility-administered medications for this visit.    Patient confirms/reports the following allergies:  No Known Allergies  No orders of the defined types were placed in this encounter.   AUTHORIZATION INFORMATION Primary Insurance: [facility],  ID #: [id number],  Group #: [id number] Gypsy Pre-Cert / Josem Kaufmann required: No, not required  SCHEDULE  INFORMATION: Procedure has been scheduled as follows:  Date: 11/03/2021, Time: 10:00  Location: [facility] with Dr. Abbey Chatters  This Gastroenterology Pre-Precedure Review Form is being routed to the following provider(s): Aliene Altes, PA-C

## 2021-10-16 ENCOUNTER — Other Ambulatory Visit: Payer: Self-pay | Admitting: *Deleted

## 2021-10-16 DIAGNOSIS — Z860100 Personal history of colon polyps, unspecified: Secondary | ICD-10-CM

## 2021-10-16 NOTE — Progress Notes (Signed)
OK to schedule. ASA II.   BMP at pre-op.

## 2021-11-01 ENCOUNTER — Other Ambulatory Visit (HOSPITAL_COMMUNITY)
Admission: RE | Admit: 2021-11-01 | Discharge: 2021-11-01 | Disposition: A | Discharge status: Home / Self Care | Payer: Medicare HMO | Source: Ambulatory Visit | Attending: Internal Medicine | Admitting: Internal Medicine

## 2021-11-01 DIAGNOSIS — Z86010 Personal history of colonic polyps: Secondary | ICD-10-CM | POA: Diagnosis not present

## 2021-11-01 LAB — BASIC METABOLIC PANEL
Anion gap: 7 (ref 5–15)
BUN: 13 mg/dL (ref 8–23)
CO2: 29 mmol/L (ref 22–32)
Calcium: 9.1 mg/dL (ref 8.9–10.3)
Chloride: 101 mmol/L (ref 98–111)
Creatinine, Ser: 0.66 mg/dL (ref 0.44–1.00)
GFR, Estimated: >60 mL/min (ref >60)
Glucose, Bld: 152 mg/dL — ABNORMAL HIGH (ref 70–99)
Potassium: 3.6 mmol/L (ref 3.5–5.1)
Sodium: 137 mmol/L (ref 135–145)

## 2021-11-03 ENCOUNTER — Ambulatory Visit (HOSPITAL_COMMUNITY): Payer: Medicare HMO | Admitting: Anesthesiology

## 2021-11-03 ENCOUNTER — Ambulatory Visit (HOSPITAL_COMMUNITY)
Admission: RE | Admit: 2021-11-03 | Discharge: 2021-11-03 | Disposition: A | Discharge status: Home / Self Care | Payer: Medicare HMO | Attending: Internal Medicine | Admitting: Internal Medicine

## 2021-11-03 ENCOUNTER — Encounter (HOSPITAL_COMMUNITY): Payer: Self-pay

## 2021-11-03 ENCOUNTER — Other Ambulatory Visit: Payer: Self-pay

## 2021-11-03 ENCOUNTER — Encounter (HOSPITAL_COMMUNITY)
Admission: RE | Disposition: A | Discharge status: Home / Self Care | Payer: Self-pay | Source: Home / Self Care | Attending: Internal Medicine

## 2021-11-03 DIAGNOSIS — K644 Residual hemorrhoidal skin tags: Secondary | ICD-10-CM | POA: Insufficient documentation

## 2021-11-03 DIAGNOSIS — K6389 Other specified diseases of intestine: Secondary | ICD-10-CM | POA: Insufficient documentation

## 2021-11-03 DIAGNOSIS — Z86010 Personal history of colonic polyps: Secondary | ICD-10-CM | POA: Diagnosis not present

## 2021-11-03 DIAGNOSIS — Z1211 Encounter for screening for malignant neoplasm of colon: Secondary | ICD-10-CM | POA: Insufficient documentation

## 2021-11-03 DIAGNOSIS — K648 Other hemorrhoids: Secondary | ICD-10-CM | POA: Insufficient documentation

## 2021-11-03 DIAGNOSIS — Z87891 Personal history of nicotine dependence: Secondary | ICD-10-CM | POA: Diagnosis not present

## 2021-11-03 HISTORY — PX: COLONOSCOPY WITH PROPOFOL: SHX5780

## 2021-11-03 SURGERY — COLONOSCOPY WITH PROPOFOL
Anesthesia: General

## 2021-11-03 MED ORDER — PROPOFOL 10 MG/ML IV BOLUS
INTRAVENOUS | Status: DC | PRN
  Administered 2021-11-03: 100 mg via INTRAVENOUS
  Administered 2021-11-03 (×3): 50 mg via INTRAVENOUS

## 2021-11-03 MED ORDER — LACTATED RINGERS IV SOLN: INTRAVENOUS | Status: DC

## 2021-11-03 NOTE — Discharge Instructions (Addendum)
  Colonoscopy Discharge Instructions  Read the instructions outlined below and refer to this sheet in the next few weeks. These discharge instructions provide you with general information on caring for yourself after you leave the hospital. Your doctor may also give you specific instructions. While your treatment has been planned according to the most current medical practices available, unavoidable complications occasionally occur.   ACTIVITY You may resume your regular activity, but move at a slower pace for the next 24 hours.  Take frequent rest periods for the next 24 hours.  Walking will help get rid of the air and reduce the bloated feeling in your belly (abdomen).  No driving for 24 hours (because of the medicine (anesthesia) used during the test).   Do not sign any important legal documents or operate any machinery for 24 hours (because of the anesthesia used during the test).  NUTRITION Drink plenty of fluids.  You may resume your normal diet as instructed by your doctor.  Begin with a light meal and progress to your normal diet. Heavy or fried foods are harder to digest and may make you feel sick to your stomach (nauseated).  Avoid alcoholic beverages for 24 hours or as instructed.  MEDICATIONS You may resume your normal medications unless your doctor tells you otherwise.  WHAT YOU CAN EXPECT TODAY Some feelings of bloating in the abdomen.  Passage of more gas than usual.  Spotting of blood in your stool or on the toilet paper.  IF YOU HAD POLYPS REMOVED DURING THE COLONOSCOPY: No aspirin products for 7 days or as instructed.  No alcohol for 7 days or as instructed.  Eat a soft diet for the next 24 hours.  FINDING OUT THE RESULTS OF YOUR TEST Not all test results are available during your visit. If your test results are not back during the visit, make an appointment with your caregiver to find out the results. Do not assume everything is normal if you have not heard from your  caregiver or the medical facility. It is important for you to follow up on all of your test results.  SEEK IMMEDIATE MEDICAL ATTENTION IF: You have more than a spotting of blood in your stool.  Your belly is swollen (abdominal distention).  You are nauseated or vomiting.  You have a temperature over 101.  You have abdominal pain or discomfort that is severe or gets worse throughout the day.   Your colonoscopy was relatively unremarkable.  I did not find any polyps or evidence of colon cancer.  I recommend repeating colonoscopy in 10 years for colon cancer screening purposes.  You do have internal hemorrhoids. I would recommend increasing fiber in your diet or adding OTC Benefiber/Metamucil. Be sure to drink at least 4 to 6 glasses of water daily. Follow-up with GI as needed.   I hope you have a great rest of your week!  Charles K. Carver, D.O. Gastroenterology and Hepatology [facility]\ 

## 2021-11-03 NOTE — Anesthesia Postprocedure Evaluation (Signed)
Anesthesia Post Note  Patient: Dana Boyd  Procedure(s) Performed: COLONOSCOPY WITH PROPOFOL  Patient location during evaluation: Phase II Anesthesia Type: General Level of consciousness: awake Pain management: pain level controlled Vital Signs Assessment: post-procedure vital signs reviewed and stable Respiratory status: spontaneous breathing and respiratory function stable Cardiovascular status: blood pressure returned to baseline and stable Postop Assessment: no headache and no apparent nausea or vomiting Anesthetic complications: no Comments: Late entry   No notable events documented.   Last Vitals:  Vitals:   11/03/21 0856 11/03/21 1040  BP: 139/70 (!) 96/50  Pulse:  76  Resp:  16  Temp:  36.4 C  SpO2:  94%    Last Pain:  Vitals:   11/03/21 1040  TempSrc: Oral  PainSc: 0-No pain                 Louann Sjogren

## 2021-11-03 NOTE — Anesthesia Preprocedure Evaluation (Signed)
Anesthesia Evaluation  Patient identified by MRN, date of birth, ID band Patient awake    Reviewed: Allergy & Precautions, H&P , NPO status , Patient's Chart, lab work & pertinent test results, reviewed documented beta blocker date and time   Airway Mallampati: II  TM Distance: >3 FB Neck ROM: full    Dental no notable dental hx.    Pulmonary neg pulmonary ROS, former smoker,    Pulmonary exam normal breath sounds clear to auscultation       Cardiovascular Exercise Tolerance: Good hypertension, negative cardio ROS   Rhythm:regular Rate:Normal     Neuro/Psych negative neurological ROS  negative psych ROS   GI/Hepatic negative GI ROS, Neg liver ROS,   Endo/Other  negative endocrine ROS  Renal/GU negative Renal ROS  negative genitourinary   Musculoskeletal   Abdominal   Peds  Hematology negative hematology ROS (+)   Anesthesia Other Findings   Reproductive/Obstetrics negative OB ROS                             Anesthesia Physical Anesthesia Plan  ASA: 2  Anesthesia Plan: General   Post-op Pain Management:    Induction:   PONV Risk Score and Plan: Propofol infusion  Airway Management Planned:   Additional Equipment:   Intra-op Plan:   Post-operative Plan:   Informed Consent: I have reviewed the patients History and Physical, chart, labs and discussed the procedure including the risks, benefits and alternatives for the proposed anesthesia with the patient or authorized representative who has indicated his/her understanding and acceptance.     Dental Advisory Given  Plan Discussed with: CRNA  Anesthesia Plan Comments:         Anesthesia Quick Evaluation  

## 2021-11-03 NOTE — Transfer of Care (Signed)
Immediate Anesthesia Transfer of Care Note  Patient: Dana Boyd  Procedure(s) Performed: COLONOSCOPY WITH PROPOFOL  Patient Location: Endoscopy Unit  Anesthesia Type:General  Level of Consciousness: awake  Airway & Oxygen Therapy: Patient Spontanous Breathing  Post-op Assessment: Report given to RN and Post -op Vital signs reviewed and stable  Post vital signs: Reviewed and stable  Last Vitals:  Vitals Value Taken Time  BP    Temp    Pulse    Resp    SpO2      Last Pain:  Vitals:   11/03/21 1020  TempSrc:   PainSc: 0-No pain      Patients Stated Pain Goal: 8 (40/76/80 8811)  Complications: No notable events documented.

## 2021-11-03 NOTE — Op Note (Signed)
[facility] Patient Name: Dana Boyd Procedure Date: 11/03/2021 10:12 AM MRN: [id number] Date of Birth: [birth year] Attending MD: Elon Alas. Abbey Chatters DO CSN: [id number] Age: 65 Admit Type: Outpatient Procedure:                Colonoscopy Indications:              High risk colon cancer surveillance: Personal                            history of colonic polyps Providers:                Elon Alas. Abbey Chatters, DO, Lurline Del, RN, Randa Spike, Technician Referring MD:              Medicines:                See the Anesthesia note for documentation of the                            administered medications Complications:            No immediate complications. Estimated Blood Loss:     Estimated blood loss: none. Procedure:                Pre-Anesthesia Assessment:                           - The anesthesia plan was to use monitored                            anesthesia care (MAC).                           After obtaining informed consent, the colonoscope                            was passed under direct vision. Throughout the                            procedure, the patient's blood pressure, pulse, and                            oxygen saturations were monitored continuously. The                            [id number]-HQ190L ([id number]) scope was introduced through                            the anus and advanced to the the cecum, identified                            by appendiceal orifice and ileocecal valve. The                            colonoscopy was performed without difficulty. The  patient tolerated the procedure well. The quality                            of the bowel preparation was evaluated using the                            BBPS [facility] Bowel Preparation Scale) with scores                            of: Right Colon = 3, Transverse Colon = 3 and Left                            Colon = 3 (entire mucosa seen well with no  residual                            staining, small fragments of stool or opaque                            liquid). The total BBPS score equals 9. Scope In: 10:23:23 AM Scope Out: 10:36:21 AM Scope Withdrawal Time: 0 hours 10 minutes 55 seconds  Total Procedure Duration: 0 hours 12 minutes 58 seconds  Findings:      Skin tags were found on perianal exam.      Non-bleeding internal hemorrhoids were found during endoscopy.      Mild melanosis was found in the entire colon.      The exam was otherwise without abnormality. Impression:               - Perianal skin tags found on perianal exam.                           - Non-bleeding internal hemorrhoids.                           - Melanosis in the colon.                           - The examination was otherwise normal.                           - No specimens collected. Moderate Sedation:      Per Anesthesia Care Recommendation:           - Patient has a contact number available for                            emergencies. The signs and symptoms of potential                            delayed complications were discussed with the                            patient. Return to normal activities tomorrow.                            Written  discharge instructions were provided to the                            patient.                           - Resume previous diet.                           - Continue present medications.                           - Repeat colonoscopy in 10 years for screening                            purposes.                           - Return to GI clinic PRN. Procedure Code(s):        --- Professional ---                           Q7622, Colorectal cancer screening; colonoscopy on                            individual at high risk Diagnosis Code(s):        --- Professional ---                           Z86.010, Personal history of colonic polyps                           K63.89, Other specified diseases of  intestine                           K64.8, Other hemorrhoids                           K64.4, Residual hemorrhoidal skin tags CPT copyright 2019 American Medical Association. All rights reserved. The codes documented in this report are preliminary and upon coder review may  be revised to meet current compliance requirements. Elon Alas. Abbey Chatters, DO [place] Abbey Chatters, DO 11/03/2021 10:39:44 AM This report has been signed electronically. Number of Addenda: 0

## 2021-11-03 NOTE — H&P (Signed)
Primary Care Physician:  Redmond School, MD Primary Gastroenterologist:  Dr. Abbey Chatters  Pre-Procedure History & Physical: HPI:  Dana Boyd is a 65 y.o. female is here for a colonoscopy for surveillance purposes. 5 year recall, Last TCS 09/03/2016 done by Dr. Oneida Alar, tubular adenoma  Past Medical History:  Diagnosis Date   Hyperlipidemia    Hypertension    Obesity     Past Surgical History:  Procedure Laterality Date   ABDOMINAL HYSTERECTOMY  1994   left the ovaries   BREAST BIOPSY Right    CATARACT EXTRACTION Right    CESAREAN SECTION     COLONOSCOPY N/A 09/03/2016   Procedure: COLONOSCOPY;  Surgeon: [name], MD;  Location: [facility];  Service: Endoscopy;  Laterality: N/A;  11:15 Am    Prior to Admission medications   Medication Sig Start Date End Date Taking? Authorizing Provider  cholecalciferol (VITAMIN D3) 25 MCG (1000 UNIT) tablet Take 1,000 Units by mouth in the morning.   Yes [provider]  hydrochlorothiazide (HYDRODIURIL) 25 MG tablet Take 25 mg by mouth in the morning.   Yes [provider]  Omega-3 Fatty Acids (FISH OIL PO) Take 3 capsules by mouth in the morning.   Yes [provider]  polyethylene glycol-electrolytes (NULYTELY) 420 g solution Take 4,000 mLs by mouth once. 10/11/21  Yes [provider]  rosuvastatin (CRESTOR) 10 MG tablet Take 10 mg by mouth at bedtime. 09/20/21  Yes [provider]    Allergies as of 10/16/2021   (No Known Allergies)    Family History  Problem Relation Age of Onset   Heart attack Mother    Heart disease Mother    Cancer Father        lung   Multiple sclerosis Brother    Heart disease Maternal Grandmother    Dementia Paternal Grandmother    Breast cancer Sister     Social History   Socioeconomic History   Marital status: Married    Spouse name: Not on file   Number of children: Not on file   Years of education: Not on file   Highest education level: Not on  file  Occupational History   Not on file  Tobacco Use   Smoking status: Former    Years: 15.00    Types: Cigarettes   Smokeless tobacco: Never  Vaping Use   Vaping Use: Never used  Substance and Sexual Activity   Alcohol use: No   Drug use: No   Sexual activity: Yes    Birth control/protection: Surgical    Comment: hyst  Other Topics Concern   Not on file  Social History Narrative   Not on file   Social Determinants of Health   Financial Resource Strain: Low Risk    Difficulty of Paying Living Expenses: Not hard at all  Food Insecurity: No Food Insecurity   Worried About Charity fundraiser in the Last Year: Never true   [place] in the Last Year: Never true  Transportation Needs: No Transportation Needs   Lack of Transportation (Medical): No   Lack of Transportation (Non-Medical): No  Physical Activity: Sufficiently Active   Days of Exercise per Week: 4 days   Minutes of Exercise per Session: 90 min  Stress: No Stress Concern Present   Feeling of Stress : Not at all  Social Connections: Moderately Integrated   Frequency of Communication with Friends and Family: Once a week   Frequency of Social Gatherings  with Friends and Family: Once a week   Attends Religious Services: More than 4 times per year   Active Member of Clubs or Organizations: Yes   Attends Music therapist: More than 4 times per year   Marital Status: Married  Human resources officer Violence: Not At Risk   Fear of Current or Ex-Partner: No   Emotionally Abused: No   Physically Abused: No   Sexually Abused: No    Review of Systems: See HPI, otherwise negative ROS  Physical Exam: Vital signs in last 24 hours: Temp:  [98 F (36.7 C)] 98 F (36.7 C) (12/02 0854) Pulse Rate:  [74] 74 (12/02 0854) Resp:  [13] 13 (12/02 0854) BP: (139)/(70) 139/70 (12/02 0856) SpO2:  [97 %] 97 % (12/02 0854) Weight:  [88.5 kg] 88.5 kg (12/02 0854)   General:   Alert,  Well-developed,  well-nourished, pleasant and cooperative in NAD Head:  Normocephalic and atraumatic. Eyes:  Sclera clear, no icterus.   Conjunctiva pink. Ears:  Normal auditory acuity. Nose:  No deformity, discharge,  or lesions. Mouth:  No deformity or lesions, dentition normal. Neck:  Supple; no masses or thyromegaly. Lungs:  Clear throughout to auscultation.   No wheezes, crackles, or rhonchi. No acute distress. Heart:  Regular rate and rhythm; no murmurs, clicks, rubs,  or gallops. Abdomen:  Soft, nontender and nondistended. No masses, hepatosplenomegaly or hernias noted. Normal bowel sounds, without guarding, and without rebound.   Msk:  Symmetrical without gross deformities. Normal posture. Extremities:  Without clubbing or edema. Neurologic:  Alert and  oriented x4;  grossly normal neurologically. Skin:  Intact without significant lesions or rashes. Cervical Nodes:  No significant cervical adenopathy. Psych:  Alert and cooperative. Normal mood and affect.  Impression/Plan: Dana Boyd is here for a colonoscopy for surveillance purposes. 5 year recall, Last TCS 09/03/2016 done by Dr. Oneida Alar, tubular adenoma  The risks of the procedure including infection, bleed, or perforation as well as benefits, limitations, alternatives and imponderables have been reviewed with the patient. Questions have been answered. All parties agreeable.

## 2021-11-08 ENCOUNTER — Encounter (HOSPITAL_COMMUNITY): Payer: Self-pay | Admitting: Internal Medicine

## 2021-11-13 IMAGING — MG MM DIGITAL SCREENING BILAT W/ TOMO AND CAD
8 series · 8 of 24 positions shown · non-contrast
Comparison: Previous exam(s).

CLINICAL DATA: Screening.

EXAM:
DIGITAL SCREENING BILATERAL MAMMOGRAM WITH TOMOSYNTHESIS AND CAD
TECHNIQUE: Bilateral screening digital craniocaudal and mediolateral oblique
mammograms were obtained. Bilateral screening digital breast
tomosynthesis was performed. The images were evaluated with
computer-aided detection.

[R CC synth-2D]
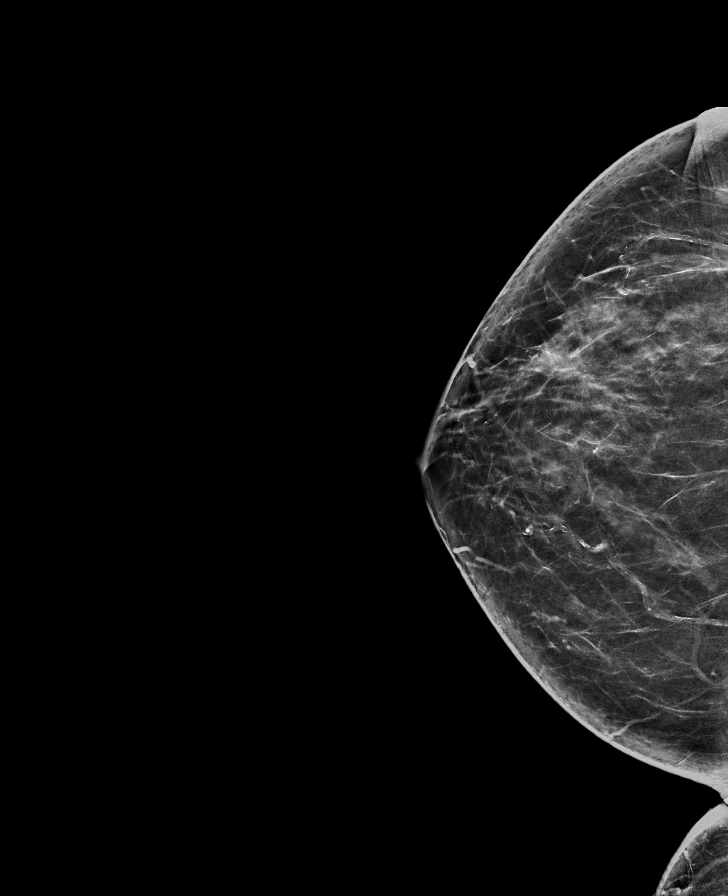

[R MLO synth-2D]
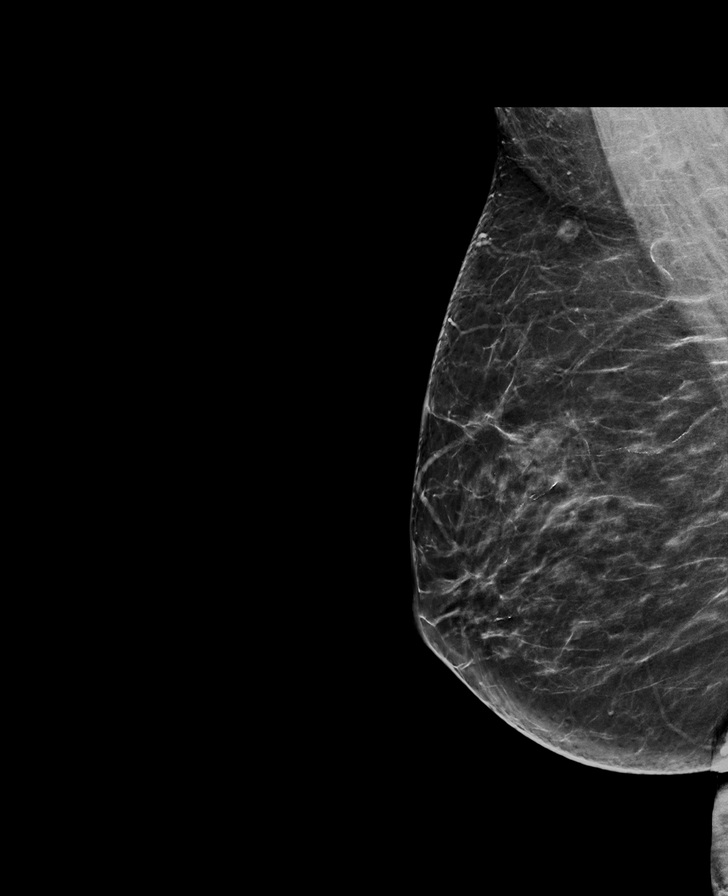

[L MLO synth-2D]
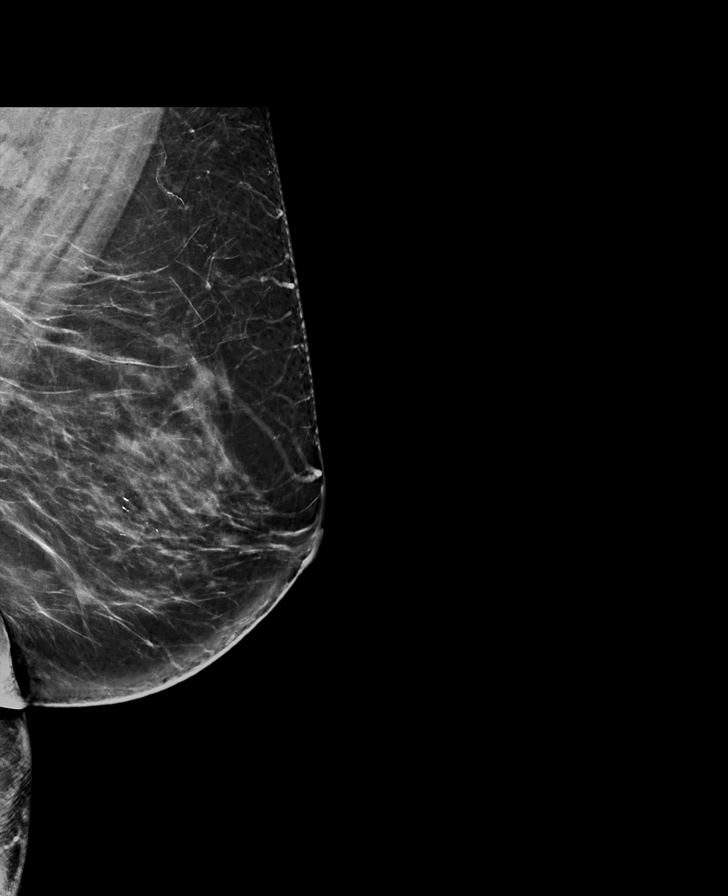

[L CC synth-2D]
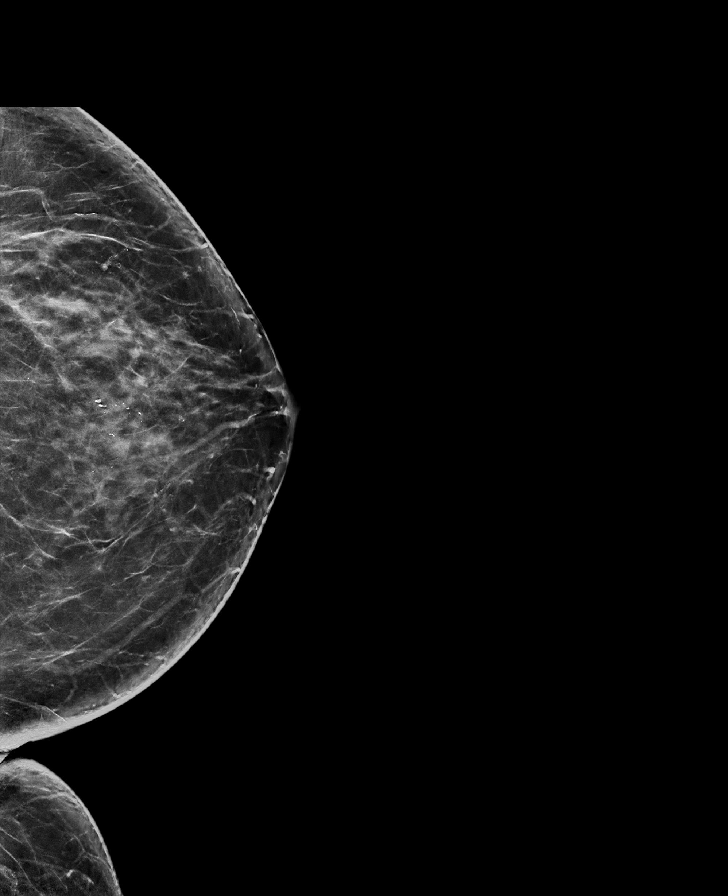

[L CC tomo · tomo slice 38/75.0]
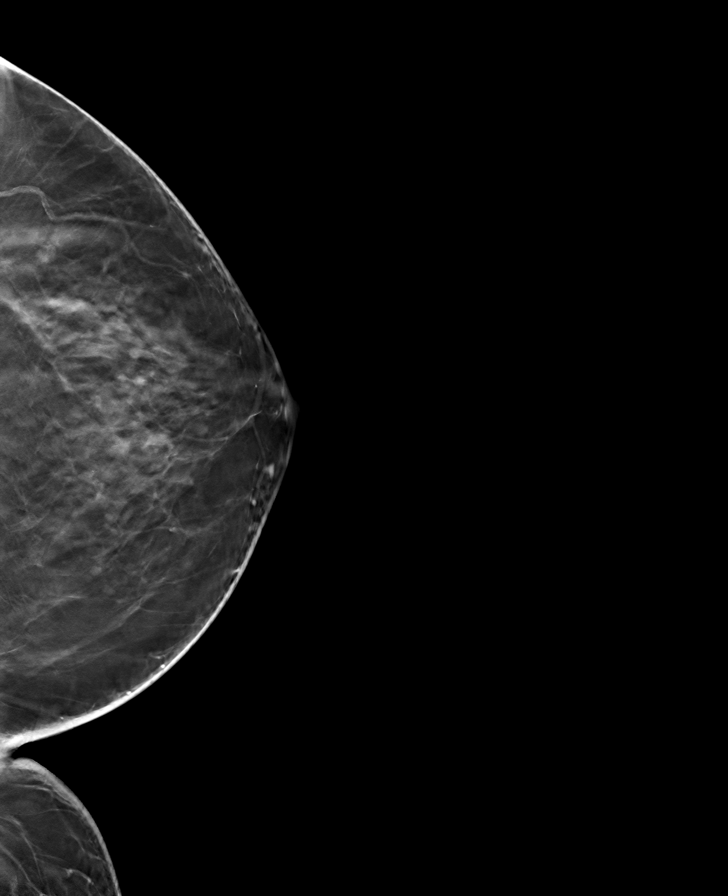

[L MLO tomo · tomo slice 43/84.0]
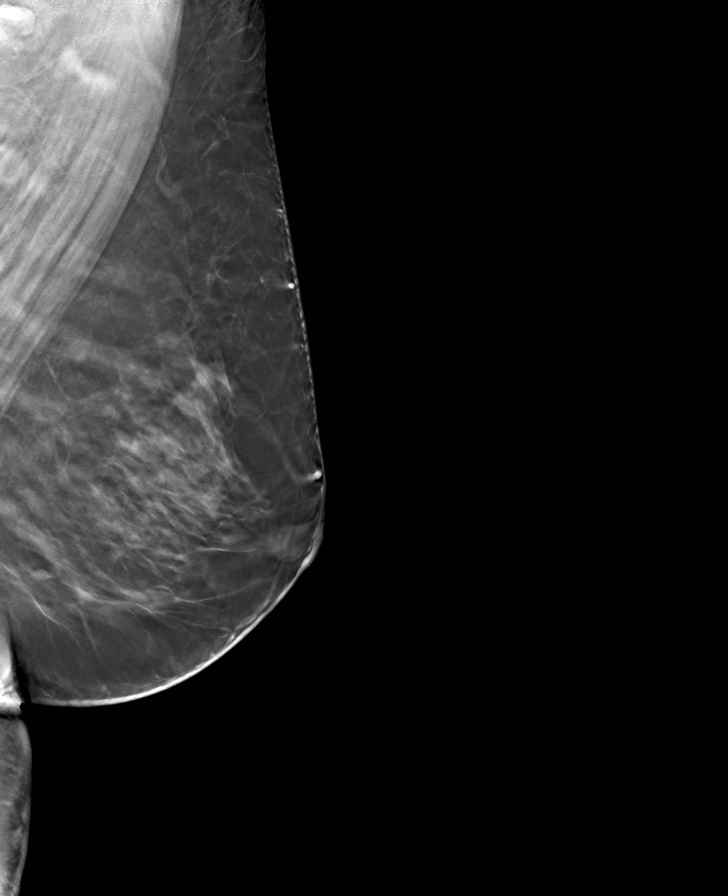

[R MLO tomo · tomo slice 39/76.0]
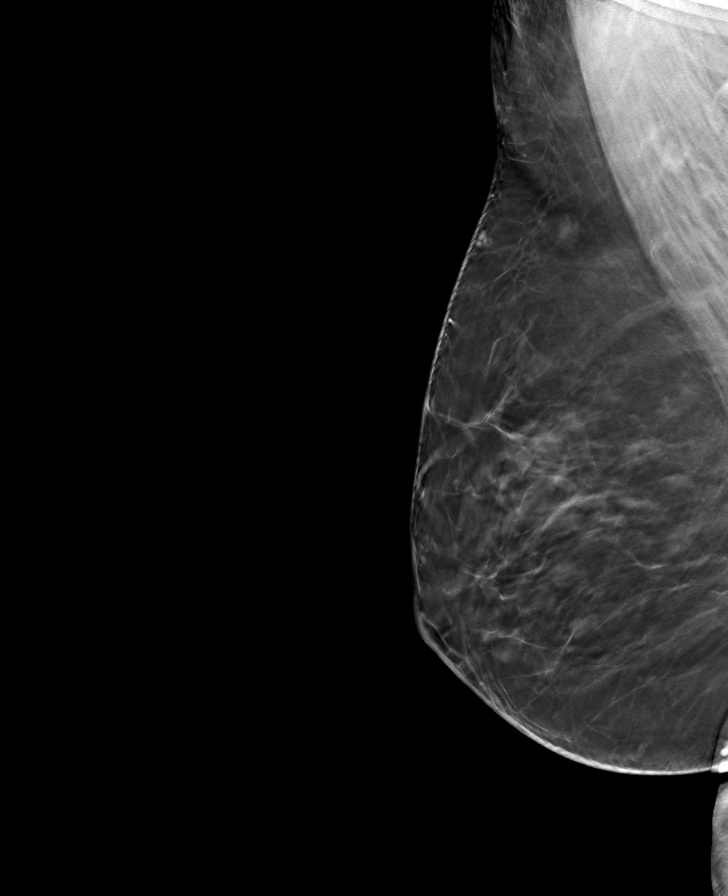

[R CC tomo · tomo slice 37/73.0]
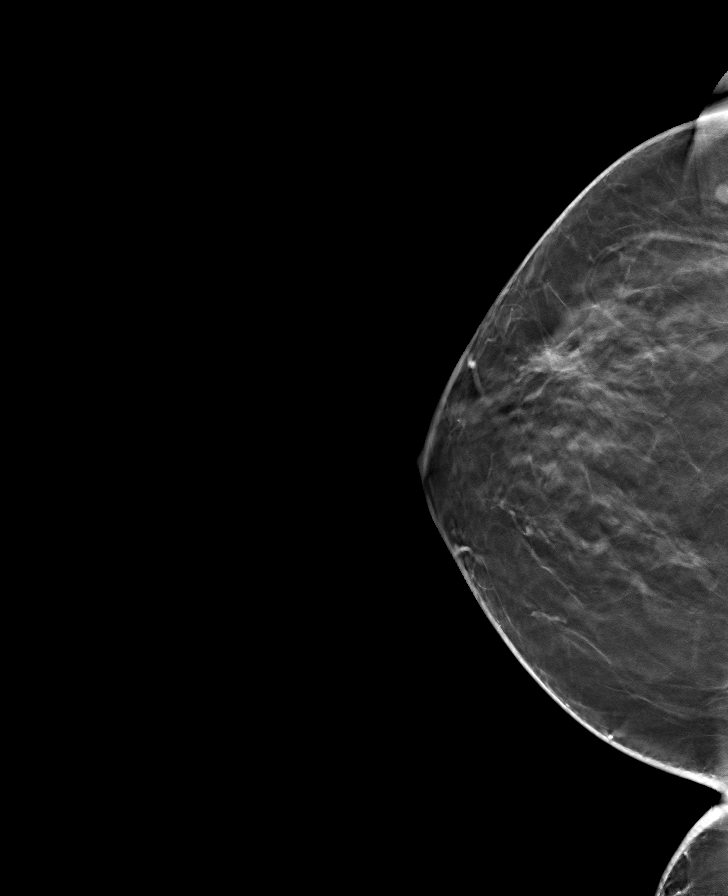

[8 of 24 positions shown; findings below may reference images not displayed]

ACR Breast Density Category c: The breast tissue is heterogeneously
dense, which may obscure small masses.
FINDINGS: There are no findings suspicious for malignancy.
IMPRESSION: No mammographic evidence of malignancy. A result letter of this
screening mammogram will be mailed directly to the patient.

RECOMMENDATION:
Screening mammogram in one year. (Code:Q3-W-BC3)

BI-RADS CATEGORY  1: Negative.

## 2021-12-11 DIAGNOSIS — Z6835 Body mass index (BMI) 35.0-35.9, adult: Secondary | ICD-10-CM | POA: Diagnosis not present

## 2021-12-11 DIAGNOSIS — E119 Type 2 diabetes mellitus without complications: Secondary | ICD-10-CM | POA: Diagnosis not present

## 2021-12-11 DIAGNOSIS — I10 Essential (primary) hypertension: Secondary | ICD-10-CM | POA: Diagnosis not present

## 2021-12-11 DIAGNOSIS — Z1331 Encounter for screening for depression: Secondary | ICD-10-CM | POA: Diagnosis not present

## 2021-12-11 DIAGNOSIS — E782 Mixed hyperlipidemia: Secondary | ICD-10-CM | POA: Diagnosis not present

## 2021-12-11 DIAGNOSIS — Z0000 Encounter for general adult medical examination without abnormal findings: Secondary | ICD-10-CM | POA: Diagnosis not present

## 2022-01-10 ENCOUNTER — Encounter: Payer: Self-pay | Admitting: Adult Health

## 2022-01-10 ENCOUNTER — Ambulatory Visit (INDEPENDENT_AMBULATORY_CARE_PROVIDER_SITE_OTHER): Payer: Medicare HMO | Admitting: Adult Health

## 2022-01-10 ENCOUNTER — Other Ambulatory Visit: Payer: Self-pay

## 2022-01-10 VITALS — BP 137/78 | HR 91 | Ht 61.5 in | Wt 201.0 lb

## 2022-01-10 DIAGNOSIS — K649 Unspecified hemorrhoids: Secondary | ICD-10-CM

## 2022-01-10 DIAGNOSIS — Z90710 Acquired absence of both cervix and uterus: Secondary | ICD-10-CM

## 2022-01-10 DIAGNOSIS — Z01419 Encounter for gynecological examination (general) (routine) without abnormal findings: Secondary | ICD-10-CM

## 2022-01-10 DIAGNOSIS — Z1211 Encounter for screening for malignant neoplasm of colon: Secondary | ICD-10-CM | POA: Diagnosis not present

## 2022-01-10 LAB — HEMOCCULT GUIAC POC 1CARD (OFFICE): Fecal Occult Blood, POC: NEGATIVE

## 2022-01-10 NOTE — Progress Notes (Signed)
Patient ID: Dana Boyd, female   DOB: [birth year], 66 y.o.   MRN: [id number] History of Present Illness:  Dana Boyd is a 66 year old black female,married, so hysterectomy in for a well woman gyn exam. PCP is Dr Dana Boyd.  Current Medications, Allergies, Past Medical History, Past Surgical History, Family History and Social History were reviewed in [facility] record.     Review of Systems:  Patient denies any headaches, hearing loss, fatigue, blurred vision, shortness of breath, chest pain, abdominal pain, problems with bowel movements, urination, or intercourse.(Still has sex occasionally).  No joint pain or mood swings.    Physical Exam:BP 137/78 (BP Location: Right Arm, Patient Position: Sitting, Cuff Size: Normal)    Pulse 91    Ht 5' 1.5" (1.562 m)    Wt 201 lb (91.2 kg)    BMI 37.36 kg/m   General:  Well developed, well nourished, no acute distress Skin:  Warm and dry Neck:  Midline trachea, normal thyroid, good ROM, no lymphadenopathy,no carotid bruits heard Lungs; Clear to auscultation bilaterally Breast:  No dominant palpable mass, retraction, or nipple discharge Cardiovascular: Regular rate and rhythm Abdomen:  Soft, non tender, no hepatosplenomegaly Pelvic:  External genitalia is normal in appearance, no lesions.  The vagina is pale with loss of rugae. Urethra has no lesions or masses. The cervix and uterus are absent.  No adnexal masses or tenderness noted.Bladder is non tender, no masses felt. Rectal: Good sphincter tone, no polyps, + internal and external hemorrhoids felt.  Hemoccult negative. Extremities/musculoskeletal:  No swelling or varicosities noted, no clubbing or cyanosis Psych:  No mood changes, alert and cooperative,seems happy AA is 1 Fall risk is low Depression screen [facility] 2/9 01/10/2022 01/09/2021 12/07/2019  Decreased Interest 0 0 0  Down, Depressed, Hopeless 0 0 0  PHQ - 2 Score 0 0 0  Altered sleeping 0 0 -  Tired, decreased energy 0 0 -   Change in appetite 0 0 -  Feeling bad or failure about yourself  0 0 -  Trouble concentrating 0 0 -  Moving slowly or fidgety/restless 0 0 -  Suicidal thoughts 0 0 -  PHQ-9 Score 0 0 -    GAD 7 : Generalized Anxiety Score 01/10/2022 01/09/2021  Nervous, Anxious, on Edge 0 0  Control/stop worrying 1 0  Worry too much - different things 1 0  Trouble relaxing 0 0  Restless 0 0  Easily annoyed or irritable 0 0  Afraid - awful might happen 0 0  Total GAD 7 Score 2 0    Examination chaperoned by Balinda Quails NP student and she assisted in exam.  Impression and Plan: 1. Encounter for well woman exam with routine gynecological exam Physical in 2 year Mammogram yearly Colonoscopy per [facility] with PCP  2. Encounter for screening fecal occult blood testing  3. Hemorrhoids, unspecified hemorrhoid type Uses Preparation H prn  4. S/P hysterectomy No pap needed

## 2022-07-24 ENCOUNTER — Other Ambulatory Visit (HOSPITAL_COMMUNITY): Payer: Self-pay | Admitting: Internal Medicine

## 2022-07-24 DIAGNOSIS — Z6835 Body mass index (BMI) 35.0-35.9, adult: Secondary | ICD-10-CM | POA: Diagnosis not present

## 2022-07-24 DIAGNOSIS — M25562 Pain in left knee: Secondary | ICD-10-CM | POA: Diagnosis not present

## 2022-07-24 DIAGNOSIS — I10 Essential (primary) hypertension: Secondary | ICD-10-CM | POA: Diagnosis not present

## 2022-07-24 DIAGNOSIS — M159 Polyosteoarthritis, unspecified: Secondary | ICD-10-CM | POA: Diagnosis not present

## 2022-09-04 ENCOUNTER — Other Ambulatory Visit (HOSPITAL_COMMUNITY): Payer: Self-pay | Admitting: Obstetrics & Gynecology

## 2022-09-04 DIAGNOSIS — Z1231 Encounter for screening mammogram for malignant neoplasm of breast: Secondary | ICD-10-CM

## 2022-09-27 ENCOUNTER — Ambulatory Visit (HOSPITAL_COMMUNITY): Payer: Medicare HMO

## 2022-09-28 ENCOUNTER — Ambulatory Visit (HOSPITAL_COMMUNITY)
Admission: RE | Admit: 2022-09-28 | Discharge: 2022-09-28 | Disposition: A | Discharge status: Home / Self Care | Payer: Medicare HMO | Source: Ambulatory Visit | Attending: Obstetrics & Gynecology | Admitting: Obstetrics & Gynecology

## 2022-09-28 DIAGNOSIS — Z1231 Encounter for screening mammogram for malignant neoplasm of breast: Secondary | ICD-10-CM | POA: Diagnosis not present

## 2022-10-15 DIAGNOSIS — H43811 Vitreous degeneration, right eye: Secondary | ICD-10-CM | POA: Diagnosis not present

## 2022-10-15 DIAGNOSIS — H524 Presbyopia: Secondary | ICD-10-CM | POA: Diagnosis not present

## 2022-10-29 ENCOUNTER — Ambulatory Visit (HOSPITAL_COMMUNITY)
Admission: RE | Admit: 2022-10-29 | Discharge: 2022-10-29 | Disposition: A | Discharge status: Home / Self Care | Payer: Medicare HMO | Source: Ambulatory Visit | Attending: Internal Medicine | Admitting: Internal Medicine

## 2022-10-29 DIAGNOSIS — M25562 Pain in left knee: Secondary | ICD-10-CM | POA: Insufficient documentation

## 2022-12-13 DIAGNOSIS — Z6835 Body mass index (BMI) 35.0-35.9, adult: Secondary | ICD-10-CM | POA: Diagnosis not present

## 2022-12-13 DIAGNOSIS — M159 Polyosteoarthritis, unspecified: Secondary | ICD-10-CM | POA: Diagnosis not present

## 2022-12-13 DIAGNOSIS — Z1331 Encounter for screening for depression: Secondary | ICD-10-CM | POA: Diagnosis not present

## 2022-12-13 DIAGNOSIS — E782 Mixed hyperlipidemia: Secondary | ICD-10-CM | POA: Diagnosis not present

## 2022-12-13 DIAGNOSIS — E1165 Type 2 diabetes mellitus with hyperglycemia: Secondary | ICD-10-CM | POA: Diagnosis not present

## 2022-12-13 DIAGNOSIS — Z0001 Encounter for general adult medical examination with abnormal findings: Secondary | ICD-10-CM | POA: Diagnosis not present

## 2022-12-13 DIAGNOSIS — E7849 Other hyperlipidemia: Secondary | ICD-10-CM | POA: Diagnosis not present

## 2022-12-13 DIAGNOSIS — I10 Essential (primary) hypertension: Secondary | ICD-10-CM | POA: Diagnosis not present

## 2022-12-13 DIAGNOSIS — E6609 Other obesity due to excess calories: Secondary | ICD-10-CM | POA: Diagnosis not present

## 2022-12-25 ENCOUNTER — Other Ambulatory Visit (HOSPITAL_COMMUNITY): Payer: Self-pay | Admitting: Family Medicine

## 2022-12-25 DIAGNOSIS — F17201 Nicotine dependence, unspecified, in remission: Secondary | ICD-10-CM

## 2023-01-14 ENCOUNTER — Ambulatory Visit: Payer: Medicare HMO | Admitting: Adult Health

## 2023-01-14 ENCOUNTER — Encounter: Payer: Self-pay | Admitting: Adult Health

## 2023-01-14 VITALS — BP 135/73 | HR 73 | Ht 62.0 in | Wt 197.5 lb

## 2023-01-14 DIAGNOSIS — N6321 Unspecified lump in the left breast, upper outer quadrant: Secondary | ICD-10-CM

## 2023-01-14 NOTE — Progress Notes (Signed)
  Subjective:     Patient ID: Dana Boyd, female   DOB: 1955/12/12, 67 y.o.   MRN: [id number]  HPI Naseem is a 67 year old black female, married, sp hysterectomy in complaining of left breat mass x 1 week.   PCP is Dr Gerarda Fraction.  Review of Systems Left breast lump,noticed a week ago Reviewed past medical,surgical, social and family history. Reviewed medications and allergies.     Objective:   Physical Exam BP 135/73 (BP Location: Left Arm, Patient Position: Sitting, Cuff Size: Normal)   Pulse 73   Ht '5\' 2"'$  (1.575 m)   Wt 197 lb 8 oz (89.6 kg)   BMI 36.12 kg/m   Skin warm and dry,  Breasts:no dominate palpable mass, retraction or nipple discharge, on the right, on the left, no retraction or nipple discharge, has pea size, firm mass, no mobile or tender, at about 1  0' clock 8 FB from areola, under arm.    Fall risk is low  Upstream - 01/14/23 1023       Pregnancy Intention Screening   Does the patient want to become pregnant in the next year? N/A    Does the patient's partner want to become pregnant in the next year? N/A    Would the patient like to discuss contraceptive options today? N/A      Contraception Wrap Up   Current Method Female Sterilization   hyst   End Method Female Sterilization   hyst   Contraception Counseling Provided No             Assessment:     1. Mass of upper outer quadrant of left breast Diagnostic left mammogram and [place] scheduled for 01/31/23 at [facility] at 2:20 pm  - MM DIAG BREAST TOMO UNI LEFT; Future - US BREAST LTD UNI LEFT INC AXILLA; Future     Plan:     Follow up prn

## 2023-01-31 ENCOUNTER — Ambulatory Visit (HOSPITAL_COMMUNITY)
Admission: RE | Admit: 2023-01-31 | Discharge: 2023-01-31 | Disposition: A | Discharge status: Home / Self Care | Payer: Medicare HMO | Source: Ambulatory Visit | Attending: Adult Health | Admitting: Adult Health

## 2023-01-31 DIAGNOSIS — N6321 Unspecified lump in the left breast, upper outer quadrant: Secondary | ICD-10-CM | POA: Insufficient documentation

## 2023-01-31 DIAGNOSIS — R922 Inconclusive mammogram: Secondary | ICD-10-CM | POA: Diagnosis not present

## 2023-01-31 DIAGNOSIS — N6489 Other specified disorders of breast: Secondary | ICD-10-CM | POA: Diagnosis not present

## 2023-02-14 ENCOUNTER — Encounter (HOSPITAL_COMMUNITY): Payer: Self-pay

## 2023-02-14 ENCOUNTER — Ambulatory Visit (HOSPITAL_COMMUNITY)
Admission: RE | Admit: 2023-02-14 | Discharge: 2023-02-14 | Disposition: A | Discharge status: Home / Self Care | Payer: Medicare HMO | Source: Ambulatory Visit | Attending: Family Medicine | Admitting: Family Medicine

## 2023-02-14 DIAGNOSIS — F17201 Nicotine dependence, unspecified, in remission: Secondary | ICD-10-CM

## 2023-03-06 ENCOUNTER — Encounter: Payer: Self-pay | Admitting: Adult Health

## 2023-03-06 ENCOUNTER — Ambulatory Visit (INDEPENDENT_AMBULATORY_CARE_PROVIDER_SITE_OTHER): Payer: Medicare HMO | Admitting: Adult Health

## 2023-03-06 VITALS — BP 145/83 | HR 82 | Ht 62.0 in | Wt 196.5 lb

## 2023-03-06 DIAGNOSIS — Z90710 Acquired absence of both cervix and uterus: Secondary | ICD-10-CM

## 2023-03-06 DIAGNOSIS — R232 Flushing: Secondary | ICD-10-CM | POA: Insufficient documentation

## 2023-03-06 DIAGNOSIS — I10 Essential (primary) hypertension: Secondary | ICD-10-CM

## 2023-03-06 DIAGNOSIS — Z01419 Encounter for gynecological examination (general) (routine) without abnormal findings: Secondary | ICD-10-CM | POA: Diagnosis not present

## 2023-03-06 NOTE — Progress Notes (Signed)
Patient ID: Dana Boyd, female   DOB: September 21, 1956, 67 y.o.   MRN: [id number] History of Present Illness: Dana Boyd is a 67 year old black female, married, sp hysterectomy in for a well woman gyn exam. She had left breast US in February for tenderness and mass and it had resolved when had mammogram and [place]. She is having some hot flashes.   PCP is Dr Gerarda Fraction.  Current Medications, Allergies, Past Medical History, Past Surgical History, Family History and Social History were reviewed in [facility] record.     Review of Systems: Patient denies any headaches, hearing loss, fatigue, blurred vision, shortness of breath, chest pain, abdominal pain, problems with bowel movements, urination, or intercourse. No joint pain or mood swings.  +hot flashes   Physical Exam:BP (!) 145/83 (BP Location: Right Arm, Patient Position: Sitting, Cuff Size: Normal)   Pulse 82   Ht 5\' 2"  (1.575 m)   Wt 196 lb 8 oz (89.1 kg)   BMI 35.94 kg/m   General:  Well developed, well nourished, no acute distress Skin:  Warm and dry Neck:  Midline trachea, normal thyroid, good ROM, no lymphadenopathy,no carotid bruits heard Lungs; Clear to auscultation bilaterally Breast:  No dominant palpable mass, retraction, or nipple discharge Cardiovascular: Regular rate and rhythm Abdomen:  Soft, non tender, no hepatosplenomegaly Pelvic:  External genitalia is normal in appearance, no lesions.  The vagina is normal in appearance is pale. Urethra has no lesions or masses. The cervix and uterus is absent.  No adnexal masses or tenderness noted.Bladder is non tender, no masses felt. Co exam with Alvera Singh NP student Rectal: Deferred at pt request, has external hemorrhoids  Extremities/musculoskeletal:  No swelling or varicosities noted, no clubbing or cyanosis Psych:  No mood changes, alert and cooperative,seems happy AA is 1 Fall risk is low    03/06/2023   10:50 AM 01/10/2022    2:38 PM 01/09/2021   11:44 AM   Depression screen PHQ 2/9  Decreased Interest 0 0 0  Down, Depressed, Hopeless 0 0 0  PHQ - 2 Score 0 0 0  Altered sleeping 0 0 0  Tired, decreased energy 0 0 0  Change in appetite 0 0 0  Feeling bad or failure about yourself  0 0 0  Trouble concentrating 0 0 0  Moving slowly or fidgety/restless 0 0 0  Suicidal thoughts 0 0 0  PHQ-9 Score 0 0 0       03/06/2023   10:51 AM 01/10/2022    2:38 PM 01/09/2021   11:47 AM  GAD 7 : Generalized Anxiety Score  Nervous, Anxious, on Edge 0 0 0  Control/stop worrying 0 1 0  Worry too much - different things 0 1 0  Trouble relaxing 0 0 0  Restless 0 0 0  Easily annoyed or irritable 0 0 0  Afraid - awful might happen 0 0 0  Total GAD 7 Score 0 2 0    Upstream - 03/06/23 1055       Pregnancy Intention Screening   Does the patient want to become pregnant in the next year? N/A    Does the patient's partner want to become pregnant in the next year? N/A    Would the patient like to discuss contraceptive options today? N/A      Contraception Wrap Up   Current Method Female Sterilization   hyst   End Method Female Sterilization   hyst   Contraception Counseling Provided No  Examination chaperoned by Alvera Singh NP student    Impression and plan: 1. Encounter for well woman exam with routine gynecological exam GYN physical in 2 years Physical with PCP Mammogram next year Colonoscopy per GI Labs with PCP  2. S/P hysterectomy  3. Hypertension, unspecified type Take meds and follow up with PCP Watch carbs and salt  4. Hot flashes Try soy milk Eat green and lean

## 2023-03-15 DIAGNOSIS — E782 Mixed hyperlipidemia: Secondary | ICD-10-CM | POA: Diagnosis not present

## 2023-03-15 DIAGNOSIS — I10 Essential (primary) hypertension: Secondary | ICD-10-CM | POA: Diagnosis not present

## 2023-03-15 DIAGNOSIS — E7849 Other hyperlipidemia: Secondary | ICD-10-CM | POA: Diagnosis not present

## 2023-03-15 DIAGNOSIS — Z6834 Body mass index (BMI) 34.0-34.9, adult: Secondary | ICD-10-CM | POA: Diagnosis not present

## 2023-03-15 DIAGNOSIS — E1159 Type 2 diabetes mellitus with other circulatory complications: Secondary | ICD-10-CM | POA: Diagnosis not present

## 2023-03-15 DIAGNOSIS — E6609 Other obesity due to excess calories: Secondary | ICD-10-CM | POA: Diagnosis not present

## 2023-05-24 DIAGNOSIS — Z6834 Body mass index (BMI) 34.0-34.9, adult: Secondary | ICD-10-CM | POA: Diagnosis not present

## 2023-05-24 DIAGNOSIS — I10 Essential (primary) hypertension: Secondary | ICD-10-CM | POA: Diagnosis not present

## 2023-05-24 DIAGNOSIS — E1159 Type 2 diabetes mellitus with other circulatory complications: Secondary | ICD-10-CM | POA: Diagnosis not present

## 2023-05-24 DIAGNOSIS — T675XXA Heat exhaustion, unspecified, initial encounter: Secondary | ICD-10-CM | POA: Diagnosis not present

## 2023-05-24 DIAGNOSIS — E6609 Other obesity due to excess calories: Secondary | ICD-10-CM | POA: Diagnosis not present

## 2023-06-12 DIAGNOSIS — E7849 Other hyperlipidemia: Secondary | ICD-10-CM | POA: Diagnosis not present

## 2023-06-12 DIAGNOSIS — R748 Abnormal levels of other serum enzymes: Secondary | ICD-10-CM | POA: Diagnosis not present

## 2023-08-30 ENCOUNTER — Other Ambulatory Visit (HOSPITAL_COMMUNITY): Payer: Self-pay | Admitting: Obstetrics & Gynecology

## 2023-08-30 DIAGNOSIS — Z1231 Encounter for screening mammogram for malignant neoplasm of breast: Secondary | ICD-10-CM

## 2023-09-16 DIAGNOSIS — R748 Abnormal levels of other serum enzymes: Secondary | ICD-10-CM | POA: Diagnosis not present

## 2023-10-02 ENCOUNTER — Encounter (HOSPITAL_COMMUNITY): Payer: Self-pay

## 2023-10-02 ENCOUNTER — Ambulatory Visit (HOSPITAL_COMMUNITY)
Admission: RE | Admit: 2023-10-02 | Discharge: 2023-10-02 | Disposition: A | Discharge status: Home / Self Care | Payer: Medicare HMO | Source: Ambulatory Visit | Attending: Obstetrics & Gynecology | Admitting: Obstetrics & Gynecology

## 2023-10-02 DIAGNOSIS — Z1231 Encounter for screening mammogram for malignant neoplasm of breast: Secondary | ICD-10-CM | POA: Diagnosis not present

## 2023-10-10 DIAGNOSIS — Z6834 Body mass index (BMI) 34.0-34.9, adult: Secondary | ICD-10-CM | POA: Diagnosis not present

## 2023-10-10 DIAGNOSIS — E6609 Other obesity due to excess calories: Secondary | ICD-10-CM | POA: Diagnosis not present

## 2023-10-10 DIAGNOSIS — M1711 Unilateral primary osteoarthritis, right knee: Secondary | ICD-10-CM | POA: Diagnosis not present

## 2023-10-22 DIAGNOSIS — Z0100 Encounter for examination of eyes and vision without abnormal findings: Secondary | ICD-10-CM | POA: Diagnosis not present

## 2023-10-22 DIAGNOSIS — H43813 Vitreous degeneration, bilateral: Secondary | ICD-10-CM | POA: Diagnosis not present

## 2023-10-22 DIAGNOSIS — H524 Presbyopia: Secondary | ICD-10-CM | POA: Diagnosis not present

## 2023-11-06 DIAGNOSIS — Z6835 Body mass index (BMI) 35.0-35.9, adult: Secondary | ICD-10-CM | POA: Diagnosis not present

## 2023-11-06 DIAGNOSIS — E785 Hyperlipidemia, unspecified: Secondary | ICD-10-CM | POA: Diagnosis not present

## 2023-11-06 DIAGNOSIS — R32 Unspecified urinary incontinence: Secondary | ICD-10-CM | POA: Diagnosis not present

## 2023-11-06 DIAGNOSIS — E1122 Type 2 diabetes mellitus with diabetic chronic kidney disease: Secondary | ICD-10-CM | POA: Diagnosis not present

## 2023-11-06 DIAGNOSIS — Z87891 Personal history of nicotine dependence: Secondary | ICD-10-CM | POA: Diagnosis not present

## 2023-11-06 DIAGNOSIS — Z809 Family history of malignant neoplasm, unspecified: Secondary | ICD-10-CM | POA: Diagnosis not present

## 2023-11-06 DIAGNOSIS — E669 Obesity, unspecified: Secondary | ICD-10-CM | POA: Diagnosis not present

## 2023-11-06 DIAGNOSIS — Z791 Long term (current) use of non-steroidal anti-inflammatories (NSAID): Secondary | ICD-10-CM | POA: Diagnosis not present

## 2023-11-06 DIAGNOSIS — N189 Chronic kidney disease, unspecified: Secondary | ICD-10-CM | POA: Diagnosis not present

## 2023-11-06 DIAGNOSIS — Z8249 Family history of ischemic heart disease and other diseases of the circulatory system: Secondary | ICD-10-CM | POA: Diagnosis not present

## 2023-11-06 DIAGNOSIS — M1990 Unspecified osteoarthritis, unspecified site: Secondary | ICD-10-CM | POA: Diagnosis not present

## 2023-11-06 DIAGNOSIS — I129 Hypertensive chronic kidney disease with stage 1 through stage 4 chronic kidney disease, or unspecified chronic kidney disease: Secondary | ICD-10-CM | POA: Diagnosis not present

## 2024-01-09 DIAGNOSIS — Z6835 Body mass index (BMI) 35.0-35.9, adult: Secondary | ICD-10-CM | POA: Diagnosis not present

## 2024-01-09 DIAGNOSIS — E6609 Other obesity due to excess calories: Secondary | ICD-10-CM | POA: Diagnosis not present

## 2024-01-09 DIAGNOSIS — F4320 Adjustment disorder, unspecified: Secondary | ICD-10-CM | POA: Diagnosis not present

## 2024-01-09 DIAGNOSIS — Z1331 Encounter for screening for depression: Secondary | ICD-10-CM | POA: Diagnosis not present

## 2024-01-09 DIAGNOSIS — F17201 Nicotine dependence, unspecified, in remission: Secondary | ICD-10-CM | POA: Diagnosis not present

## 2024-01-09 DIAGNOSIS — I10 Essential (primary) hypertension: Secondary | ICD-10-CM | POA: Diagnosis not present

## 2024-01-09 DIAGNOSIS — M1711 Unilateral primary osteoarthritis, right knee: Secondary | ICD-10-CM | POA: Diagnosis not present

## 2024-01-09 DIAGNOSIS — Z0001 Encounter for general adult medical examination with abnormal findings: Secondary | ICD-10-CM | POA: Diagnosis not present

## 2024-01-09 DIAGNOSIS — E1159 Type 2 diabetes mellitus with other circulatory complications: Secondary | ICD-10-CM | POA: Diagnosis not present

## 2024-01-09 DIAGNOSIS — R748 Abnormal levels of other serum enzymes: Secondary | ICD-10-CM | POA: Diagnosis not present

## 2024-01-09 DIAGNOSIS — E782 Mixed hyperlipidemia: Secondary | ICD-10-CM | POA: Diagnosis not present

## 2024-01-10 NOTE — Progress Notes (Signed)
 Patient took her BP medicine this morning. Manual BP 160/90.  Saw Dr. Cleora Daft on 2/6.

## 2024-01-31 DIAGNOSIS — E782 Mixed hyperlipidemia: Secondary | ICD-10-CM | POA: Diagnosis not present

## 2024-01-31 DIAGNOSIS — R748 Abnormal levels of other serum enzymes: Secondary | ICD-10-CM | POA: Diagnosis not present

## 2024-01-31 DIAGNOSIS — I10 Essential (primary) hypertension: Secondary | ICD-10-CM | POA: Diagnosis not present

## 2024-01-31 DIAGNOSIS — E1159 Type 2 diabetes mellitus with other circulatory complications: Secondary | ICD-10-CM | POA: Diagnosis not present

## 2024-02-28 NOTE — Progress Notes (Addendum)
 The patient attended a screening event on 01/10/24 where her blood pressure was measured at 172/90,160/90(manual). During the event, the patient reported that she is established with a primary care provider (PCP), and does have ipv social determinants of health (SDOH) concerns. Pt reported at the event that she had seen her pcp Dana Boyd in [place] on 01/09/24. A chart review confirmed that the patient does have an active PCP.  The patient's most recent office visit was on 04/08/24.  Hypertension is appropriately listed in her medical record and her pcp is aware, and no future appointments are scheduled. The patient demonstrates consistent engagement in her care. CHW called pt and left a message for a call back. Upon reviewing the pts chart the pt has updated her ipv sdoh since the event and no longer has ipv as a concern. At this time, no additional support from the Health [facility] Team is indicated.

## 2024-03-06 ENCOUNTER — Ambulatory Visit: Payer: Medicare HMO | Admitting: Adult Health

## 2024-04-01 DIAGNOSIS — E782 Mixed hyperlipidemia: Secondary | ICD-10-CM | POA: Diagnosis not present

## 2024-04-01 DIAGNOSIS — E1159 Type 2 diabetes mellitus with other circulatory complications: Secondary | ICD-10-CM | POA: Diagnosis not present

## 2024-04-08 ENCOUNTER — Ambulatory Visit: Admitting: Adult Health

## 2024-04-08 ENCOUNTER — Encounter: Payer: Self-pay | Admitting: Adult Health

## 2024-04-08 VITALS — BP 138/85 | HR 73 | Ht 63.0 in | Wt 198.0 lb

## 2024-04-08 DIAGNOSIS — Z01419 Encounter for gynecological examination (general) (routine) without abnormal findings: Secondary | ICD-10-CM

## 2024-04-08 DIAGNOSIS — Z90710 Acquired absence of both cervix and uterus: Secondary | ICD-10-CM

## 2024-04-08 DIAGNOSIS — K649 Unspecified hemorrhoids: Secondary | ICD-10-CM | POA: Diagnosis not present

## 2024-04-08 DIAGNOSIS — Z1331 Encounter for screening for depression: Secondary | ICD-10-CM | POA: Diagnosis not present

## 2024-04-08 DIAGNOSIS — L720 Epidermal cyst: Secondary | ICD-10-CM

## 2024-04-08 DIAGNOSIS — Z1211 Encounter for screening for malignant neoplasm of colon: Secondary | ICD-10-CM

## 2024-04-08 DIAGNOSIS — I10 Essential (primary) hypertension: Secondary | ICD-10-CM

## 2024-04-08 LAB — HEMOCCULT GUIAC POC 1CARD (OFFICE): Fecal Occult Blood, POC: NEGATIVE

## 2024-04-08 NOTE — Progress Notes (Signed)
 Patient ID: Dana Boyd, female   DOB: 12-15-1955, 68 y.o.   MRN: [id number] History of Present Illness: Dana Boyd is a 68 year old black female, married, sp hysterectomy in for a well woman gyn exam.  She is retired for 7 years now.   PCP is Dr Lewayne Records.  Current Medications, Allergies, Past Medical History, Past Surgical History, Family History and Social History were reviewed in [facility] electronic medical record.     Review of Systems: Patient denies any headaches, hearing loss, fatigue, blurred vision, shortness of breath, chest pain, abdominal pain, problems with bowel movements, urination, or intercourse. No joint pain or mood swings.     Physical Exam:BP 138/85 (BP Location: Left Arm, Patient Position: Sitting, Cuff Size: Large)   Pulse 73   Ht 5\' 3"  (1.6 m)   Wt 198 lb (89.8 kg)   BMI 35.07 kg/m   General:  Well developed, well nourished, no acute distress Skin:  Warm and dry Neck:  Midline trachea, normal thyroid, good ROM, no lymphadenopathy, no carotid bruits heard Lungs; Clear to auscultation bilaterally Breast:  No dominant palpable mass, retraction, or nipple discharge Cardiovascular: Regular rate and rhythm Abdomen:  Soft, non tender, no hepatosplenomegaly Pelvic:  External genitalia is normal in appearance, has small epidermal cyst left groin .  The vagina is pale. Urethra has no lesions or masses. The cervix and uterus are absent.  No adnexal masses or tenderness noted.Bladder is non tender, no masses felt. Rectal: Good sphincter tone, no polyps, +internal hemorrhoids felt, and has external ones,+rectocele.  Hemoccult negative. Extremities/musculoskeletal:  No swelling or varicosities noted, no clubbing or cyanosis Psych:  No mood changes, alert and cooperative,seems happy AA is 1 Fall risk is low    04/08/2024   10:46 AM 03/06/2023   10:50 AM 01/10/2022    2:38 PM  Depression screen PHQ 2/9  Decreased Interest 0 0 0  Down, Depressed, Hopeless 0 0 0  PHQ - 2  Score 0 0 0  Altered sleeping 0 0 0  Tired, decreased energy 0 0 0  Change in appetite 0 0 0  Feeling bad or failure about yourself  0 0 0  Trouble concentrating 0 0 0  Moving slowly or fidgety/restless 0 0 0  Suicidal thoughts 0 0 0  PHQ-9 Score 0 0 0       04/08/2024   10:47 AM 03/06/2023   10:51 AM 01/10/2022    2:38 PM 01/09/2021   11:47 AM  GAD 7 : Generalized Anxiety Score  Nervous, Anxious, on Edge 0 0 0 0  Control/stop worrying 0 0 1 0  Worry too much - different things 0 0 1 0  Trouble relaxing 0 0 0 0  Restless 0 0 0 0  Easily annoyed or irritable 0 0 0 0  Afraid - awful might happen 0 0 0 0  Total GAD 7 Score 0 0 2 0      Upstream - 04/08/24 1046       Pregnancy Intention Screening   Does the patient want to become pregnant in the next year? No    Does the patient's partner want to become pregnant in the next year? No    Would the patient like to discuss contraceptive options today? No      Contraception Wrap Up   Current Method Female Sterilization   hyst   End Method Female Sterilization   hyst   Contraception Counseling Provided No  Examination chaperoned by Alphonso Aschoff LPN  Impression and plan: 1. Encounter for well woman exam with routine gynecological exam (Primary) Physical in 2 years Labs with PCP Mammogram was negative 10/02/23 Colonoscopy per GI  Stay active   2. S/P hysterectomy  3. Hemorrhoids, unspecified hemorrhoid type Not bothering her  4. Encounter for screening fecal occult blood testing Hemoccult was negative  - POCT occult blood stool  5. Epidermal cyst  6. Hypertension Continue BP meds and follow up with PCP

## 2024-04-22 ENCOUNTER — Telehealth: Payer: Self-pay

## 2024-04-22 NOTE — Telephone Encounter (Signed)
 CHW left a message for a call back.

## 2024-05-02 DIAGNOSIS — E782 Mixed hyperlipidemia: Secondary | ICD-10-CM | POA: Diagnosis not present

## 2024-05-02 DIAGNOSIS — E1159 Type 2 diabetes mellitus with other circulatory complications: Secondary | ICD-10-CM | POA: Diagnosis not present

## 2024-07-13 ENCOUNTER — Encounter: Payer: Self-pay | Admitting: Physician Assistant

## 2024-07-13 ENCOUNTER — Ambulatory Visit: Admitting: Physician Assistant

## 2024-07-13 VITALS — BP 147/81 | HR 95 | Temp 98.4°F | Ht 63.0 in | Wt 195.6 lb

## 2024-07-13 DIAGNOSIS — E119 Type 2 diabetes mellitus without complications: Secondary | ICD-10-CM | POA: Insufficient documentation

## 2024-07-13 DIAGNOSIS — Z7689 Persons encountering health services in other specified circumstances: Secondary | ICD-10-CM

## 2024-07-13 DIAGNOSIS — E7800 Pure hypercholesterolemia, unspecified: Secondary | ICD-10-CM

## 2024-07-13 DIAGNOSIS — E1159 Type 2 diabetes mellitus with other circulatory complications: Secondary | ICD-10-CM | POA: Diagnosis not present

## 2024-07-13 DIAGNOSIS — I10 Essential (primary) hypertension: Secondary | ICD-10-CM | POA: Diagnosis not present
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
## 2024-07-13 NOTE — Assessment & Plan Note (Signed)
 Stable. Continue with current management without changes. Discussed healthy diet and lifestyle. Lipid panel.

## 2024-07-13 NOTE — Assessment & Plan Note (Signed)
 Stable.  Continue current management.  Lab work today.  Continue dietary efforts and physical activity. Routine diabetic retinopathy screening: up-to-date. Foot exam and monofilament test done this year. Obtaining records from previous PCP.

## 2024-07-13 NOTE — Assessment & Plan Note (Addendum)
 142/83  Stable. Continue current medications. No change in management. Discussed DASH diet and dietary sodium restrictions.  Continue dietary efforts and physical activity.

## 2024-07-14 ENCOUNTER — Ambulatory Visit: Payer: Self-pay | Admitting: Physician Assistant

## 2024-07-14 LAB — CMP14+EGFR
ALT: 42 IU/L — ABNORMAL HIGH (ref 0–32)
AST: 33 IU/L (ref 0–40)
Albumin: 4.6 g/dL (ref 3.9–4.9)
Alkaline Phosphatase: 174 IU/L — ABNORMAL HIGH (ref 44–121)
BUN/Creatinine Ratio: 23 (ref 12–28)
BUN: 16 mg/dL (ref 8–27)
Bilirubin Total: 0.4 mg/dL (ref 0.0–1.2)
CO2: 25 mmol/L (ref 20–29)
Calcium: 10 mg/dL (ref 8.7–10.3)
Chloride: 99 mmol/L (ref 96–106)
Creatinine, Ser: 0.7 mg/dL (ref 0.57–1.00)
Globulin, Total: 3 g/dL (ref 1.5–4.5)
Glucose: 202 mg/dL — ABNORMAL HIGH (ref 70–99)
Potassium: 3.7 mmol/L (ref 3.5–5.2)
Sodium: 139 mmol/L (ref 134–144)
Total Protein: 7.6 g/dL (ref 6.0–8.5)
eGFR: 94 mL/min/1.73 (ref >59)

## 2024-07-14 LAB — CBC WITH DIFFERENTIAL/PLATELET
Basophils Absolute: 0 x10E3/uL (ref 0.0–0.2)
Basos: 0 %
EOS (ABSOLUTE): 0.3 x10E3/uL (ref 0.0–0.4)
Eos: 3 %
Hematocrit: 44.7 % (ref 34.0–46.6)
Hemoglobin: 15 g/dL (ref 11.1–15.9)
Immature Grans (Abs): 0 x10E3/uL (ref 0.0–0.1)
Immature Granulocytes: 0 %
Lymphocytes Absolute: 3.1 x10E3/uL (ref 0.7–3.1)
Lymphs: 25 %
MCH: 33 pg (ref 26.6–33.0)
MCHC: 33.6 g/dL (ref 31.5–35.7)
MCV: 99 fL — ABNORMAL HIGH (ref 79–97)
Monocytes Absolute: 0.9 x10E3/uL (ref 0.1–0.9)
Monocytes: 7 %
Neutrophils Absolute: 8.3 x10E3/uL — ABNORMAL HIGH (ref 1.4–7.0)
Neutrophils: 65 %
Platelets: 328 x10E3/uL (ref 150–450)
RBC: 4.54 x10E6/uL (ref 3.77–5.28)
RDW: 12.9 % (ref 11.7–15.4)
WBC: 12.7 x10E3/uL — ABNORMAL HIGH (ref 3.4–10.8)

## 2024-07-14 LAB — LIPID PANEL
Chol/HDL Ratio: 3.9 ratio (ref 0.0–4.4)
Cholesterol, Total: 173 mg/dL (ref 100–199)
HDL: 44 mg/dL (ref >39)
LDL Chol Calc (NIH): 103 mg/dL — ABNORMAL HIGH (ref 0–99)
Triglycerides: 149 mg/dL (ref 0–149)
VLDL Cholesterol Cal: 26 mg/dL (ref 5–40)

## 2024-07-14 LAB — HEMOGLOBIN A1C
Est. average glucose Bld gHb Est-mCnc: 143 mg/dL
Hgb A1c MFr Bld: 6.6 % — ABNORMAL HIGH (ref 4.8–5.6)

## 2024-08-19 ENCOUNTER — Ambulatory Visit: Admitting: Physician Assistant

## 2024-09-02 ENCOUNTER — Encounter: Payer: Self-pay | Admitting: Adult Health

## 2024-09-02 ENCOUNTER — Other Ambulatory Visit (HOSPITAL_COMMUNITY)
Admission: RE | Admit: 2024-09-02 | Discharge: 2024-09-02 | Disposition: A | Discharge status: Home / Self Care | Source: Ambulatory Visit | Attending: Adult Health | Admitting: Adult Health

## 2024-09-02 ENCOUNTER — Ambulatory Visit: Admitting: Adult Health

## 2024-09-02 VITALS — BP 145/83 | HR 97 | Ht 63.0 in | Wt 190.5 lb

## 2024-09-02 DIAGNOSIS — Z90710 Acquired absence of both cervix and uterus: Secondary | ICD-10-CM | POA: Diagnosis not present

## 2024-09-02 DIAGNOSIS — I10 Essential (primary) hypertension: Secondary | ICD-10-CM

## 2024-09-02 DIAGNOSIS — B369 Superficial mycosis, unspecified: Secondary | ICD-10-CM | POA: Diagnosis not present

## 2024-09-02 DIAGNOSIS — N898 Other specified noninflammatory disorders of vagina: Secondary | ICD-10-CM

## 2024-09-02 MED ORDER — FLUCONAZOLE 150 MG PO TABS: ORAL_TABLET | ORAL | 1 refills | Status: DC

## 2024-09-02 NOTE — Progress Notes (Addendum)
  Subjective:     Patient ID: Dana Boyd, female   DOB: Nov 05, 1956, 68 y.o.   MRN: [id number]  HPI Dana Boyd is a 68 year old black female, married, sp hysterectomy in complaining of itching and burning in vaginal area for about a week and chaffing in the groin area. Tried monistat and vagisil with some relief and tried talcum powder in the groin.  PCP is C Grooms PA.  Review of Systems + itching and burning in vaginal area for about a week and chaffing in the groin area. Tried monistat and vagisil with some relief and tried talcum powder in the groin.   Reviewed past medical,surgical, social and family history. Reviewed medications and allergies.  Objective:   Physical Exam BP (!) 145/83 (BP Location: Left Arm, Patient Position: Sitting, Cuff Size: Normal)   Pulse 97   Ht 5' 3 (1.6 m)   Wt 190 lb 8 oz (86.4 kg)   BMI 33.75 kg/m     Skin warm and dry.Pelvic: external genitalia is has red scaly skin in groin, R>L and vulva slightly red and right one slightly swollen, painted with gentian violet, vagina: white discharge without odor, painted with gentian violet,urethra has no lesions or masses noted, cervix and uterus are absent,adnexa: no masses or tenderness noted. Bladder is non tender and no masses felt. CV swab obtained. Fall risk is low  Upstream - 09/02/24 1417       Pregnancy Intention Screening   Does the patient want to become pregnant in the next year? N/A    Does the patient's partner want to become pregnant in the next year? N/A    Would the patient like to discuss contraceptive options today? N/A      Contraception Wrap Up   Current Method Female Sterilization   hyst   End Method Female Sterilization   hyst   Contraception Counseling Provided No         Examination chaperoned by Clarita Salt LPN  Assessment:     1. Itching in the vaginal area (Primary) + itching and burning in vaginal area for about a week and chaffing in the groin area. Tried monistat and vagisil  with some relief and tried talcum powder in the groin. Painted with gentian violet and rx diflucan She says it feels better already CV swab sent  - Cervicovaginal ancillary only( [place])  2. Superficial fungus infection of skin Red and scaly in groin area Painted with gentian violet  Will rx diflucan Meds ordered this encounter  Medications   fluconazole (DIFLUCAN) 150 MG tablet    Sig: Take 1 now and 1 in 3 days, do not crestor when taking    Dispense:  2 tablet    Refill:  1    Supervising Provider:   JAYNE MINDER H [[id number]]   Pat dry with bath   3. S/P hysterectomy  4. Vaginal discharge CV swab sent for BV and yeast  - Cervicovaginal ancillary only( [place])     5. Hypertension Continue meds and follow up with PCP Plan:     Follow up in 1 week for recheck

## 2024-09-03 ENCOUNTER — Other Ambulatory Visit (HOSPITAL_COMMUNITY): Payer: Self-pay | Admitting: Adult Health

## 2024-09-03 DIAGNOSIS — Z1231 Encounter for screening mammogram for malignant neoplasm of breast: Secondary | ICD-10-CM

## 2024-09-04 ENCOUNTER — Ambulatory Visit: Payer: Self-pay | Admitting: Adult Health

## 2024-09-04 LAB — CERVICOVAGINAL ANCILLARY ONLY
Bacterial Vaginitis (gardnerella): NEGATIVE
Candida Glabrata: POSITIVE — AB
Candida Vaginitis: POSITIVE — AB
Comment: NEGATIVE
Comment: NEGATIVE
Comment: NEGATIVE

## 2024-09-09 ENCOUNTER — Encounter: Payer: Self-pay | Admitting: Adult Health

## 2024-09-09 ENCOUNTER — Ambulatory Visit: Admitting: Adult Health

## 2024-09-09 VITALS — BP 151/78 | HR 83 | Ht 63.0 in | Wt 190.5 lb

## 2024-09-09 DIAGNOSIS — N898 Other specified noninflammatory disorders of vagina: Secondary | ICD-10-CM

## 2024-09-09 DIAGNOSIS — I10 Essential (primary) hypertension: Secondary | ICD-10-CM | POA: Diagnosis not present

## 2024-09-09 DIAGNOSIS — B369 Superficial mycosis, unspecified: Secondary | ICD-10-CM | POA: Diagnosis not present

## 2024-09-09 DIAGNOSIS — Z90710 Acquired absence of both cervix and uterus: Secondary | ICD-10-CM

## 2024-09-09 NOTE — Progress Notes (Signed)
  Subjective:     Patient ID: Dana Boyd, female   DOB: [birth year], 68 y.o.   MRN: [id number]  HPI Dana Boyd is a 68 year old black female,married, sp hysterectomy back in follow up on being treated for vaginal itching and yeast with gentian violet and diflucan 09/02/24 and is much better.  PCP is C Grooms PA  Review of Systems Itching has resolved    Reviewed past medical,surgical, social and family history. Reviewed medications and allergies.  Objective:   Physical Exam BP (!) 151/78 (BP Location: Left Arm, Patient Position: Sitting, Cuff Size: Normal)   Pulse 83   Ht 5' 3 (1.6 m)   Wt 190 lb 8 oz (86.4 kg)   BMI 33.75 kg/m     Skin warm and dry.Pelvic: external genitalia is normal in appearance no lesions, no redness in groin, vagina: scant white discharge without odor,urethra has no lesions or masses noted, cervix and uterus are absent,adnexa: no masses or tenderness noted. Bladder is non tender and no masses felt.  Upstream - 09/09/24 0920       Pregnancy Intention Screening   Does the patient want to become pregnant in the next year? N/A    Does the patient's partner want to become pregnant in the next year? N/A    Would the patient like to discuss contraceptive options today? N/A      Contraception Wrap Up   Current Method Female Sterilization   hyst   End Method Female Sterilization   hyst   Contraception Counseling Provided No         Examination chaperoned by Clarita Salt LPN  Assessment:     1. Itching in the vaginal area (Primary) Has resolved   2. Superficial fungus infection of skin Looks much better, no longer red  3. S/P hysterectomy  4. Primary hypertension Continue BP meds and follow up with PCP    Plan:     Follow up prn

## 2024-10-05 ENCOUNTER — Encounter (HOSPITAL_COMMUNITY): Payer: Self-pay

## 2024-10-05 ENCOUNTER — Ambulatory Visit (HOSPITAL_COMMUNITY)
Admission: RE | Admit: 2024-10-05 | Discharge: 2024-10-05 | Disposition: A | Discharge status: Home / Self Care | Source: Ambulatory Visit | Attending: Adult Health | Admitting: Adult Health

## 2024-10-05 DIAGNOSIS — Z1231 Encounter for screening mammogram for malignant neoplasm of breast: Secondary | ICD-10-CM | POA: Insufficient documentation

## 2024-10-07 ENCOUNTER — Ambulatory Visit: Payer: Self-pay | Admitting: Adult Health

## 2024-11-02 DIAGNOSIS — H524 Presbyopia: Secondary | ICD-10-CM | POA: Diagnosis not present

## 2024-11-02 DIAGNOSIS — H43813 Vitreous degeneration, bilateral: Secondary | ICD-10-CM | POA: Diagnosis not present

## 2024-11-02 DIAGNOSIS — Z0100 Encounter for examination of eyes and vision without abnormal findings: Secondary | ICD-10-CM | POA: Diagnosis not present

## 2024-12-30 ENCOUNTER — Ambulatory Visit: Admitting: Obstetrics & Gynecology

## 2024-12-30 ENCOUNTER — Encounter: Payer: Self-pay | Admitting: Obstetrics & Gynecology

## 2024-12-30 VITALS — BP 143/85 | Ht 63.0 in | Wt 195.0 lb

## 2024-12-30 DIAGNOSIS — L02214 Cutaneous abscess of groin: Secondary | ICD-10-CM | POA: Diagnosis not present

## 2024-12-30 MED ORDER — CEPHALEXIN 500 MG PO CAPS: 500.0000 mg | ORAL_CAPSULE | Freq: Three times a day (TID) | ORAL | 0 refills | Status: AC

## 2024-12-30 NOTE — Progress Notes (Signed)
" ° °  GYN VISIT Patient name: Dana Boyd MRN [id number]  Date of birth: 05/24/1956 Chief Complaint:   Dana Boyd on thigh/vagina  History of Present Illness:   Dana Boyd is a 69 y.o. G41P2002 PH female being seen today for the following concerns:  -Last time saw a little knot and J. Signa said nothing to worry about.  Last few days, it has gotten much bigger and notes a burning sensation.  Also notes an odor, no drainage.  No vaginal bleeding.    No urinary concerns.  No fever/chills.  No other acute complaints  No LMP recorded. Patient has had a hysterectomy.    Review of Systems:   Pertinent items are noted in HPI Denies fever/chills, dizziness, headaches, visual disturbances, fatigue, shortness of breath, chest pain, abdominal pain, vomiting. Pertinent History Reviewed:   Past Surgical History:  Procedure Laterality Date   ABDOMINAL HYSTERECTOMY  1994   left the ovaries   BREAST BIOPSY  2000   benign   CATARACT EXTRACTION Right    CESAREAN SECTION     COLONOSCOPY N/A 09/03/2016   Procedure: COLONOSCOPY;  Surgeon: Margo LITTIE Haddock, MD;  [facility];  Service: Endoscopy;  Laterality: N/A;  11:15 Am   COLONOSCOPY WITH PROPOFOL  N/A 11/03/2021   Procedure: COLONOSCOPY WITH PROPOFOL ;  Surgeon: Cindie Carlin POUR, DO;  [facility];  Service: Endoscopy;  Laterality: N/A;  10:15 / ASA II    Past Medical History:  Diagnosis Date   Diabetes mellitus without complication (HCC)    Hyperlipidemia    Hypertension    Obesity    Reviewed problem list, medications and allergies. Physical Assessment:   Vitals:   12/30/24 1424 12/30/24 1426  BP: (!) 180/92 (!) 143/85  Weight: 195 lb (88.5 kg)   Height: 5' 3 (1.6 m)   Body mass index is 34.54 kg/m.       Physical Examination:   General appearance: alert, well appearing, and in no distress  Psych: mood appropriate, normal affect  Skin: warm & dry   Cardiovascular: normal heart rate noted  Respiratory:  normal respiratory effort, no distress  Abdomen: soft, non-tender   Pelvic: left groin with ~ 1.5cm boil and surrouding erythema- minimal induration.  Tender to palpation  Extremities: no edema   Chaperone: Aleck preston    Assessment & Plan:  1) Groin abscess/boil -plan for conservative management -warm compresses and oral antibiotics -if worsening pt to call clinic ASAP for I&D procedure- otherwise plan to F/U in 1wk  Meds ordered this encounter  Medications   cephALEXin  (KEFLEX ) 500 MG capsule    Sig: Take 1 capsule (500 mg total) by mouth 3 (three) times daily for 10 days.    Dispense:  21 capsule    Refill:  0     No orders of the defined types were placed in this encounter.   Return in about 1 week (around 01/06/2025) for Follow up in 1 wk, with Dr. Charitie Hinote.   Ebelyn Bohnet, DO Attending Obstetrician & Gynecologist, [facility] for [facility], [facility]    "

## 2025-01-06 NOTE — Progress Notes (Signed)
 Dana Boyd                                          MRN: [id number]   01/06/2025   The [facility] Quality Team Specialist reviewed this patient medical record for the purposes of chart review for care gap closure. The following were reviewed: chart review for care gap closure-controlling blood pressure.    [facility] Quality Team

## 2025-01-07 ENCOUNTER — Ambulatory Visit: Admitting: Obstetrics & Gynecology

## 2025-01-07 ENCOUNTER — Encounter: Payer: Self-pay | Admitting: Obstetrics & Gynecology

## 2025-01-07 VITALS — BP 166/85 | HR 82 | Ht 63.0 in | Wt 195.0 lb

## 2025-01-07 DIAGNOSIS — L02214 Cutaneous abscess of groin: Secondary | ICD-10-CM

## 2025-01-07 NOTE — Progress Notes (Signed)
" ° °  GYN VISIT Patient name: Dana Boyd MRN [id number]  Date of birth: [birth year] Chief Complaint:   Follow-up  History of Present Illness:   Dana Boyd is a 69 y.o. G2P2002 PM, PH female being seen today for follow-up regarding:  Seen 1/28 noted to have small abscess in groin- treated with ABX.  Today she notes that she is doing much better.  She states that last Friday it actually started draining and has significantly decreased in size, but not completely gone.  Denies fevers or chills.  No longer draining.  Reports no other acute complaints or concerns  No LMP recorded. Patient has had a hysterectomy.    Review of Systems:   Pertinent items are noted in HPI Denies fever/chills, dizziness, headaches, visual disturbances, fatigue, shortness of breath, chest pain, abdominal pain, vomiting. Pertinent History Reviewed:   Past Surgical History:  Procedure Laterality Date   ABDOMINAL HYSTERECTOMY  1994   left the ovaries   BREAST BIOPSY  2000   benign   CATARACT EXTRACTION Right    CESAREAN SECTION     COLONOSCOPY N/A 09/03/2016   Procedure: COLONOSCOPY;  Surgeon: Margo LITTIE Haddock, MD;  [facility];  Service: Endoscopy;  Laterality: N/A;  11:15 Am   COLONOSCOPY WITH PROPOFOL  N/A 11/03/2021   Procedure: COLONOSCOPY WITH PROPOFOL ;  Surgeon: Cindie Carlin POUR, DO;  [facility];  Service: Endoscopy;  Laterality: N/A;  10:15 / ASA II    Past Medical History:  Diagnosis Date   Diabetes mellitus without complication (HCC)    Hyperlipidemia    Hypertension    Obesity    Reviewed problem list, medications and allergies. Physical Assessment:   Vitals:   01/07/25 1457 01/07/25 1502  BP: (!) 177/91 (!) 166/85  Pulse: 82   Weight: 195 lb (88.5 kg)   Height: 5' 3 (1.6 m)   Body mass index is 34.54 kg/m.       Physical Examination:   General appearance: alert, well appearing, and in no distress  Psych: mood appropriate, normal affect  Skin: warm & dry    Cardiovascular: normal heart rate noted  Respiratory: normal respiratory effort, no distress  Abdomen: soft, non-tender   Pelvic: Normal external genitalia.  Left groin with approximately 3 mm area of slight firmness, minimal erythema no induration noted.  Nontender to palpation significant improvement since prior visit  Extremities: no edema   Chaperone: N/A    Assessment & Plan:  1) Left groin abscess - Reviewed conservative management and anticipate complete resolution over time - No further antibiotics indicated   No orders of the defined types were placed in this encounter.   Return for yearly as needed.   [name], DO Attending Obstetrician & Gynecologist, [facility] for [facility], [facility]    "

## 2025-01-13 ENCOUNTER — Ambulatory Visit: Admitting: Physician Assistant
